# Patient Record
Sex: Female | Born: 1990 | Race: White | Hispanic: No | Marital: Married | Smoking: Never smoker
Health system: Southern US, Community
[De-identification: ages and names within clinical notes are randomized; demographics above are authoritative.]

## PROBLEM LIST (undated history)

## (undated) ENCOUNTER — Inpatient Hospital Stay (HOSPITAL_COMMUNITY): Payer: Self-pay

## (undated) DIAGNOSIS — Z789 Other specified health status: Secondary | ICD-10-CM

## (undated) DIAGNOSIS — D649 Anemia, unspecified: Secondary | ICD-10-CM

---
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
## 2014-02-01 ENCOUNTER — Inpatient Hospital Stay (HOSPITAL_COMMUNITY): Admission: AD | Admit: 2014-02-01 | Payer: Self-pay | Source: Ambulatory Visit | Admitting: Obstetrics & Gynecology

## 2015-01-07 LAB — OB RESULTS CONSOLE RUBELLA ANTIBODY, IGM: Rubella: IMMUNE

## 2015-01-07 LAB — OB RESULTS CONSOLE ABO/RH: RH Type: POSITIVE

## 2015-01-07 LAB — OB RESULTS CONSOLE HEPATITIS B SURFACE ANTIGEN: Hepatitis B Surface Ag: NEGATIVE

## 2015-01-07 LAB — OB RESULTS CONSOLE ANTIBODY SCREEN: Antibody Screen: NEGATIVE

## 2015-01-07 LAB — OB RESULTS CONSOLE GC/CHLAMYDIA
CHLAMYDIA, DNA PROBE: NEGATIVE
GC PROBE AMP, GENITAL: NEGATIVE

## 2015-01-07 LAB — OB RESULTS CONSOLE GBS: STREP GROUP B AG: POSITIVE

## 2015-01-07 LAB — OB RESULTS CONSOLE HIV ANTIBODY (ROUTINE TESTING): HIV: NONREACTIVE

## 2015-01-07 LAB — OB RESULTS CONSOLE RPR: RPR: NONREACTIVE

## 2015-02-02 NOTE — L&D Delivery Note (Signed)
Delivery Note At 1:43 AM a viable female was delivered via Vaginal, Spontaneous Delivery (Presentation: Right Occiput Anterior).  APGAR: 8, 9; weight  .   Placenta status: Intact, Spontaneous.  Cord: 3 vessels with the following complications: None.  Cord pH: not sent Fetal cord blood collected Anesthesia: Epidural  Episiotomy: None Lacerations: 2nd degree;Perineal Suture Repair: 3.0 vicryl rapide Est. Blood Loss (mL): 300  Mom to postpartum.  Baby to Couplet care / Skin to Skin.  Tashonna Descoteaux M 08/19/2015, 2:06 AM

## 2015-04-07 ENCOUNTER — Encounter (HOSPITAL_COMMUNITY): Payer: Self-pay

## 2015-04-07 ENCOUNTER — Inpatient Hospital Stay (HOSPITAL_COMMUNITY)
Admission: AD | Admit: 2015-04-07 | Discharge: 2015-04-07 | Disposition: A | Discharge status: Home / Self Care | Payer: BLUE CROSS/BLUE SHIELD | Source: Ambulatory Visit | Attending: Obstetrics and Gynecology | Admitting: Obstetrics and Gynecology

## 2015-04-07 DIAGNOSIS — O9989 Other specified diseases and conditions complicating pregnancy, childbirth and the puerperium: Secondary | ICD-10-CM

## 2015-04-07 DIAGNOSIS — O99012 Anemia complicating pregnancy, second trimester: Secondary | ICD-10-CM | POA: Insufficient documentation

## 2015-04-07 DIAGNOSIS — R55 Syncope and collapse: Secondary | ICD-10-CM | POA: Diagnosis not present

## 2015-04-07 DIAGNOSIS — D649 Anemia, unspecified: Secondary | ICD-10-CM

## 2015-04-07 DIAGNOSIS — O26892 Other specified pregnancy related conditions, second trimester: Secondary | ICD-10-CM | POA: Diagnosis not present

## 2015-04-07 DIAGNOSIS — Z3A21 21 weeks gestation of pregnancy: Secondary | ICD-10-CM | POA: Diagnosis not present

## 2015-04-07 DIAGNOSIS — E162 Hypoglycemia, unspecified: Secondary | ICD-10-CM | POA: Diagnosis not present

## 2015-04-07 DIAGNOSIS — O99891 Other specified diseases and conditions complicating pregnancy: Secondary | ICD-10-CM

## 2015-04-07 DIAGNOSIS — R8271 Bacteriuria: Secondary | ICD-10-CM | POA: Diagnosis not present

## 2015-04-07 HISTORY — DX: Other specified health status: Z78.9

## 2015-04-07 LAB — URINALYSIS, ROUTINE W REFLEX MICROSCOPIC
BILIRUBIN URINE: NEGATIVE
Glucose, UA: NEGATIVE mg/dL
KETONES UR: NEGATIVE mg/dL
LEUKOCYTES UA: NEGATIVE
NITRITE: NEGATIVE
PH: 7 (ref 5.0–8.0)
PROTEIN: NEGATIVE mg/dL
Specific Gravity, Urine: 1.015 (ref 1.005–1.030)

## 2015-04-07 LAB — CBC
HEMATOCRIT: 32 % — AB (ref 36.0–46.0)
HEMOGLOBIN: 10.9 g/dL — AB (ref 12.0–15.0)
MCH: 32.3 pg (ref 26.0–34.0)
MCHC: 34.1 g/dL (ref 30.0–36.0)
MCV: 95 fL (ref 78.0–100.0)
Platelets: 195 10*3/uL (ref 150–400)
RBC: 3.37 MIL/uL — ABNORMAL LOW (ref 3.87–5.11)
RDW: 13.2 % (ref 11.5–15.5)
WBC: 9.2 10*3/uL (ref 4.0–10.5)

## 2015-04-07 LAB — URINE MICROSCOPIC-ADD ON

## 2015-04-07 LAB — GLUCOSE, CAPILLARY
Glucose-Capillary: 66 mg/dL (ref 65–99)
Glucose-Capillary: 24 mg/dL — CL (ref 65–99)

## 2015-04-07 MED ORDER — CEPHALEXIN 500 MG PO CAPS: 500.0000 mg | ORAL_CAPSULE | Freq: Four times a day (QID) | ORAL | Status: DC

## 2015-04-07 NOTE — MAU Provider Note (Signed)
History     CSN: [id number]  Arrival date and time: 04/07/15 0904   First Provider Initiated Contact with Patient 04/07/15 1012      Chief Complaint  Patient presents with  . Loss of Consciousness   HPI   Ms.Bailie Mayer Camelatum is a 25 y.o. female G1P0 @ 23106w3d presenting via EMS following a syncopal episode that occurred at work this morning. The patient attests to missing breakfast this morning; last meal was dinner last night at 5 pm. She drank some almond milk only. She was sitting at her desk and became hot and nauseated. She stood up and felt dizzy; she leaned on a co-worker who was standing next to her and vaguely remembers being lowered to the floor by her co-worker.   She denies symptoms currently.    RN note:  Pt was at work this morning and started feeling dizzy. Pt was standing next to her co-worker and she blacked out. Her co-worker caught her and assisted her to sit down. Pt had one glass of almond milk this morning but had not eaten yet.         OB History    Gravida Para Term Preterm AB TAB SAB Ectopic Multiple Living   1         0      Past Medical History  Diagnosis Date  . Medical history non-contributory     Past Surgical History  Procedure Laterality Date  . Wisdom tooth extraction  2005    Family History  Problem Relation Age of Onset  . Cancer Maternal Grandmother   . Stroke Maternal Grandfather   . Diabetes Paternal Grandmother   . Heart disease Paternal Grandfather     Social History  Substance Use Topics  . Smoking status: Never Smoker   . Smokeless tobacco: Never Used  . Alcohol Use: No    Allergies:  Allergies  Allergen Reactions  . Benzoyl Peroxide Swelling    Prescriptions prior to admission  Medication Sig Dispense Refill Last Dose  . clindamycin (CLEOCIN T) 1 % lotion Apply 1 application topically daily.   04/06/2015 at Unknown time  . Prenatal Vit-Fe Fumarate-FA (PRENATAL MULTIVITAMIN) TABS tablet Take 1 tablet by mouth  daily at 12 noon.   04/06/2015 at Unknown time   Results for orders placed or performed during the hospital encounter of 04/07/15 (from the past 72 hour(s))  Urinalysis, Routine w reflex microscopic (not at [facility])     Status: Abnormal   Collection Time: 04/07/15  9:50 AM  Result Value Ref Range   Color, Urine YELLOW YELLOW   APPearance CLOUDY (A) CLEAR   Specific Gravity, Urine 1.015 1.005 - 1.030   pH 7.0 5.0 - 8.0   Glucose, UA NEGATIVE NEGATIVE mg/dL   Hgb urine dipstick MODERATE (A) NEGATIVE   Bilirubin Urine NEGATIVE NEGATIVE   Ketones, ur NEGATIVE NEGATIVE mg/dL   Protein, ur NEGATIVE NEGATIVE mg/dL   Nitrite NEGATIVE NEGATIVE   Leukocytes, UA NEGATIVE NEGATIVE  Urine microscopic-add on     Status: Abnormal   Collection Time: 04/07/15  9:50 AM  Result Value Ref Range   Squamous Epithelial / LPF 0-5 (A) NONE SEEN   WBC, UA 0-5 0 - 5 WBC/hpf   RBC / HPF 6-30 0 - 5 RBC/hpf   Bacteria, UA MANY (A) NONE SEEN   Urine-Other MUCOUS PRESENT     Comment: AMORPHOUS URATES/PHOSPHATES  Glucose, capillary     Status: Abnormal   Collection Time: 04/07/15  9:54  AM  Result Value Ref Range   Glucose-Capillary 24 (LL) 65 - 99 mg/dL   Comment 1 Repeat Test   Glucose, capillary     Status: None   Collection Time: 04/07/15  9:56 AM  Result Value Ref Range   Glucose-Capillary 66 65 - 99 mg/dL  CBC     Status: Abnormal   Collection Time: 04/07/15 11:01 AM  Result Value Ref Range   WBC 9.2 4.0 - 10.5 K/uL   RBC 3.37 (L) 3.87 - 5.11 MIL/uL   Hemoglobin 10.9 (L) 12.0 - 15.0 g/dL   HCT 16.1 (L) 09.6 - 04.5 %   MCV 95.0 78.0 - 100.0 fL   MCH 32.3 26.0 - 34.0 pg   MCHC 34.1 30.0 - 36.0 g/dL   RDW 40.9 81.1 - 91.4 %   Platelets 195 150 - 400 K/uL    Review of Systems  Constitutional: Negative for malaise/fatigue.  Cardiovascular: Negative for chest pain.  Genitourinary: Positive for frequency. Negative for dysuria, urgency and flank pain.   Physical Exam   Blood pressure 97/53, pulse 78,  temperature 98.4 F (36.9 C), temperature source Oral, resp. rate 17, height  (1.549 m), weight 126 lb (57.153 kg), SpO2 100 %.  Physical Exam  Constitutional: She is oriented to person, place, and time. She appears well-developed and well-nourished. No distress.  Cardiovascular: Normal rate and normal heart sounds.   Respiratory: Effort normal and breath sounds normal.  Musculoskeletal: Normal range of motion.  Neurological: She is alert and oriented to person, place, and time. GCS eye subscore is 4. GCS verbal subscore is 5. GCS motor subscore is 6.  Skin: Skin is warm. She is not diaphoretic. No pallor.  Psychiatric: Her behavior is normal.    MAU Course  Procedures  None  MDM  CBG: 66. Patient given apple juice and peanut butter crackers.  Orthostatic vitals normal  Discussed patient with Dr. Rana Snare; CBC recommended and drawn prior to discharge.  + fetal heart tones by doppler.   Assessment and Plan   A:  1. Syncope, unspecified syncope type   2. Spontaneous hypoglycemia   3. Asymptomatic bacteriuria during pregnancy   4. Anemia in pregnancy, second trimester     P:  Discharge home in stable condition Over the counter iron daily as directed on the bottle Small, frequent meals throughout the day RX: Keflex Urine culture pending  Return to MAU if symptoms return-persist Follow up with OB as scheduled  Duane Lope, NP 04/07/2015 7:05 PM

## 2015-04-07 NOTE — Discharge Instructions (Signed)
Syncope Syncope means a person passes out (faints). The person usually wakes up in less than 5 minutes. It is important to seek medical care for syncope. HOME CARE  Have someone stay with you until you feel normal.  Do not drive, use machines, or play sports until your doctor says it is okay.  Keep all doctor visits as told.  Lie down when you feel like you might pass out. Take deep breaths. Wait until you feel normal before standing up.  Drink enough fluids to keep your pee (urine) clear or pale yellow.  If you take blood pressure or heart medicine, get up slowly. Take several minutes to sit and then stand. GET HELP RIGHT AWAY IF:   You have a severe headache. You have pain in the chest, belly (abdomen), or back.Pregnancy and Urinary Tract Infection A urinary tract infection (UTI) is a bacterial infection of the urinary tract. Infection of the urinary tract can include the ureters, kidneys (pyelonephritis), bladder (cystitis), and urethra (urethritis). All pregnant women should be screened for bacteria in the urinary tract. Identifying and treating a UTI will decrease the risk of preterm labor and developing more serious infections in both the mother and baby. CAUSES Bacteria germs cause almost all UTIs.  RISK FACTORS Many factors can increase your chances of getting a UTI during pregnancy. These include: Having a short urethra. Poor toilet and hygiene habits. Sexual intercourse. Blockage of urine along the urinary tract. Problems with the pelvic muscles or nerves. Diabetes. Obesity. Bladder problems after having several children. Previous history of UTI. SIGNS AND SYMPTOMS  Pain, burning, or a stinging feeling when urinating. Suddenly feeling the need to urinate right away (urgency). Loss of bladder control (urinary incontinence). Frequent urination, more than is common with pregnancy. Lower abdominal or back discomfort. Cloudy urine. Blood in the urine  (hematuria). Fever. When the kidneys are infected, the symptoms may be: Back pain. Flank pain on the right side more so than the left. Fever. Chills. Nausea. Vomiting. DIAGNOSIS  A urinary tract infection is usually diagnosed through urine tests. Additional tests and procedures are sometimes done. These may include: Ultrasound exam of the kidneys, ureters, bladder, and urethra. Looking in the bladder with a lighted tube (cystoscopy). TREATMENT Typically, UTIs can be treated with antibiotic medicines.  HOME CARE INSTRUCTIONS  Only take over-the-counter or prescription medicines as directed by your health care provider. If you were prescribed antibiotics, take them as directed. Finish them even if you start to feel better. Drink enough fluids to keep your urine clear or pale yellow. Do not have sexual intercourse until the infection is gone and your health care provider says it is okay. Make sure you are tested for UTIs throughout your pregnancy. These infections often come back. Preventing a UTI in the Future Practice good toilet habits. Always wipe from front to back. Use the tissue only once. Do not hold your urine. Empty your bladder as soon as possible when the urge comes. Do not douche or use deodorant sprays. Wash with soap and warm water around the genital area and the anus. Empty your bladder before and after sexual intercourse. Wear underwear with a cotton crotch. Avoid caffeine and carbonated drinks. They can irritate the bladder. Drink cranberry juice or take cranberry pills. This may decrease the risk of getting a UTI. Do not drink alcohol. Keep all your appointments and tests as scheduled. SEEK MEDICAL CARE IF:  Your symptoms get worse. You are still having fevers 2 or more days  after treatment begins. You have a rash. You feel that you are having problems with medicines prescribed. You have abnormal vaginal discharge. SEEK IMMEDIATE MEDICAL CARE IF:  You have  back or flank pain. You have chills. You have blood in your urine. You have nausea and vomiting. You have contractions of your uterus. You have a gush of fluid from the vagina. MAKE SURE YOU: Understand these instructions.  Will watch your condition.  Will get help right away if you are not doing well or get worse.    This information is not intended to replace advice given to you by your health care provider. Make sure you discuss any questions you have with your health care provider.   Document Released: 05/15/2010 Document Revised: 11/08/2012 Document Reviewed: 08/17/2012 Elsevier Interactive Patient Education 2016 [employer]    You are bleeding from the mouth or butt (rectum).  You have black or tarry poop (stool).  You have an irregular or very fast heartbeat.  You have pain with breathing.  You keep passing out, or you have shaking (seizures) when you pass out.  You pass out when sitting or lying down.  You feel confused.  You have trouble walking.  You have severe weakness.  You have vision problems. If you fainted, call for help (911 in [place] Do not drive yourself to the hospital.   This information is not intended to replace advice given to you by your health care provider. Make sure you discuss any questions you have with your health care provider.   Document Released: 07/07/2007 Document Revised: 06/04/2014 Document Reviewed: 03/19/2011 Elsevier Interactive Patient Education [employer] Inc.

## 2015-04-07 NOTE — Progress Notes (Signed)
CBG 66, pt given apple juice and graham crackers with peanut butter.

## 2015-04-07 NOTE — MAU Note (Signed)
Pt was at work this morning and started feeling dizzy. Pt was standing next to her co-worker and she blacked out. Her co-worker caught her and assisted her to sit down. Pt had one glass of almond milk this morning but had not eaten yet.

## 2015-04-08 LAB — URINE CULTURE
Culture: NO GROWTH
Special Requests: NORMAL

## 2015-07-21 ENCOUNTER — Encounter (HOSPITAL_COMMUNITY): Payer: Self-pay | Admitting: *Deleted

## 2015-07-21 ENCOUNTER — Inpatient Hospital Stay (HOSPITAL_COMMUNITY)
Admission: AD | Admit: 2015-07-21 | Discharge: 2015-07-21 | Disposition: A | Discharge status: Home / Self Care | Payer: BLUE CROSS/BLUE SHIELD | Source: Ambulatory Visit | Attending: Obstetrics and Gynecology | Admitting: Obstetrics and Gynecology

## 2015-07-21 DIAGNOSIS — Z823 Family history of stroke: Secondary | ICD-10-CM | POA: Diagnosis not present

## 2015-07-21 DIAGNOSIS — O368130 Decreased fetal movements, third trimester, not applicable or unspecified: Secondary | ICD-10-CM | POA: Diagnosis not present

## 2015-07-21 DIAGNOSIS — Z888 Allergy status to other drugs, medicaments and biological substances status: Secondary | ICD-10-CM | POA: Insufficient documentation

## 2015-07-21 DIAGNOSIS — Z3A36 36 weeks gestation of pregnancy: Secondary | ICD-10-CM | POA: Insufficient documentation

## 2015-07-21 DIAGNOSIS — Z8249 Family history of ischemic heart disease and other diseases of the circulatory system: Secondary | ICD-10-CM | POA: Insufficient documentation

## 2015-07-21 DIAGNOSIS — O368190 Decreased fetal movements, unspecified trimester, not applicable or unspecified: Secondary | ICD-10-CM

## 2015-07-21 DIAGNOSIS — Z833 Family history of diabetes mellitus: Secondary | ICD-10-CM | POA: Diagnosis not present

## 2015-07-21 DIAGNOSIS — Z809 Family history of malignant neoplasm, unspecified: Secondary | ICD-10-CM | POA: Insufficient documentation

## 2015-07-21 LAB — URINALYSIS, ROUTINE W REFLEX MICROSCOPIC
Bilirubin Urine: NEGATIVE
Glucose, UA: NEGATIVE mg/dL
Ketones, ur: NEGATIVE mg/dL
LEUKOCYTES UA: NEGATIVE
NITRITE: NEGATIVE
PH: 6.5 (ref 5.0–8.0)
PROTEIN: NEGATIVE mg/dL

## 2015-07-21 LAB — URINE MICROSCOPIC-ADD ON
Squamous Epithelial / HPF: NONE SEEN
WBC, UA: NONE SEEN WBC/hpf (ref 0–5)

## 2015-07-21 NOTE — MAU Provider Note (Signed)
MAU HISTORY AND PHYSICAL  Chief Complaint:  Decreased fetal movement  Krystal Cunningham is a 25 y.o.  G1P0 with IUP at 6471w3d presenting for the above.  Happened today. Now lots of movement, back to baseline. No leakage of fluid or bleeding. Occasional mild contraction. Otherwise feeling well.  Past Medical History  Diagnosis Date  . Medical history non-contributory     Past Surgical History  Procedure Laterality Date  . Wisdom tooth extraction  2005    Family History  Problem Relation Age of Onset  . Cancer Maternal Grandmother   . Stroke Maternal Grandfather   . Diabetes Paternal Grandmother   . Heart disease Paternal Grandfather     Social History  Substance Use Topics  . Smoking status: Never Smoker   . Smokeless tobacco: Never Used  . Alcohol Use: No    Allergies  Allergen Reactions  . Benzoyl Peroxide Swelling    Prescriptions prior to admission  Medication Sig Dispense Refill Last Dose  . cephALEXin (KEFLEX) 500 MG capsule Take 1 capsule (500 mg total) by mouth 4 (four) times daily. 20 capsule 0   . clindamycin (CLEOCIN T) 1 % lotion Apply 1 application topically daily.   04/06/2015 at Unknown time  . Prenatal Vit-Fe Fumarate-FA (PRENATAL MULTIVITAMIN) TABS tablet Take 1 tablet by mouth daily at 12 noon.   04/06/2015 at Unknown time    Review of Systems - Negative except for what is mentioned in HPI.  Physical Exam  Blood pressure 104/80, pulse 76, temperature 98.3 F (36.8 C), resp. rate 18. GENERAL: Well-developed, well-nourished female in no acute distress.  LUNGS: Clear to auscultation bilaterally.  HEART: Regular rate and rhythm. ABDOMEN: Soft, nontender, nondistended, gravid.  EXTREMITIES: Nontender, no edema, 2+ distal pulses. 140/mod/+a/-d  Labs: No results found for this or any previous visit (from the past 24 hour(s)).  Imaging Studies:  No results found.  Assessment: Krystal Cunningham is  25 y.o. G1P0 at 3471w3d presents with decreased fetal  movement. Transient this afternoon, now movement back to baseline. No symptoms pprom or abruption. NST reactive. Discussed w/ Dr. Rana SnareLowe.  Plan: - d/c home - clinic f/u this week as scheduled - counseled in kick counts - return precautions discussed  Krystal Cunningham 6/19/20175:37 PM

## 2015-07-21 NOTE — Discharge Instructions (Signed)
Fetal Movement Counts  Patient Name: __________________________________________________ Patient Due Date: ____________________  Performing a fetal movement count is highly recommended in high-risk pregnancies, but it is good for every pregnant woman to do. Your health care provider may ask you to start counting fetal movements at 28 weeks of the pregnancy. Fetal movements often increase:  · After eating a full meal.  · After physical activity.  · After eating or drinking something sweet or cold.  · At rest.  Pay attention to when you feel the baby is most active. This will help you notice a pattern of your baby's sleep and wake cycles and what factors contribute to an increase in fetal movement. It is important to perform a fetal movement count at the same time each day when your baby is normally most active.   HOW TO COUNT FETAL MOVEMENTS  1. Find a quiet and comfortable area to sit or lie down on your left side. Lying on your left side provides the best blood and oxygen circulation to your baby.  2. Write down the day and time on a sheet of paper or in a journal.  3. Start counting kicks, flutters, swishes, rolls, or jabs in a 2-hour period. You should feel at least 10 movements within 2 hours.  4. If you do not feel 10 movements in 2 hours, wait 2-3 hours and count again. Look for a change in the pattern or not enough counts in 2 hours.  SEEK MEDICAL CARE IF:  · You feel less than 10 counts in 2 hours, tried twice.  · There is no movement in over an hour.  · The pattern is changing or taking longer each day to reach 10 counts in 2 hours.  · You feel the baby is not moving as he or she usually does.  Date: ____________ Movements: ____________ Start time: ____________ Finish time: ____________   Date: ____________ Movements: ____________ Start time: ____________ Finish time: ____________  Date: ____________ Movements: ____________ Start time: ____________ Finish time: ____________  Date: ____________ Movements:  ____________ Start time: ____________ Finish time: ____________  Date: ____________ Movements: ____________ Start time: ____________ Finish time: ____________  Date: ____________ Movements: ____________ Start time: ____________ Finish time: ____________  Date: ____________ Movements: ____________ Start time: ____________ Finish time: ____________  Date: ____________ Movements: ____________ Start time: ____________ Finish time: ____________   Date: ____________ Movements: ____________ Start time: ____________ Finish time: ____________  Date: ____________ Movements: ____________ Start time: ____________ Finish time: ____________  Date: ____________ Movements: ____________ Start time: ____________ Finish time: ____________  Date: ____________ Movements: ____________ Start time: ____________ Finish time: ____________  Date: ____________ Movements: ____________ Start time: ____________ Finish time: ____________  Date: ____________ Movements: ____________ Start time: ____________ Finish time: ____________  Date: ____________ Movements: ____________ Start time: ____________ Finish time: ____________   Date: ____________ Movements: ____________ Start time: ____________ Finish time: ____________  Date: ____________ Movements: ____________ Start time: ____________ Finish time: ____________  Date: ____________ Movements: ____________ Start time: ____________ Finish time: ____________  Date: ____________ Movements: ____________ Start time: ____________ Finish time: ____________  Date: ____________ Movements: ____________ Start time: ____________ Finish time: ____________  Date: ____________ Movements: ____________ Start time: ____________ Finish time: ____________  Date: ____________ Movements: ____________ Start time: ____________ Finish time: ____________   Date: ____________ Movements: ____________ Start time: ____________ Finish time: ____________  Date: ____________ Movements: ____________ Start time: ____________ Finish  time: ____________  Date: ____________ Movements: ____________ Start time: ____________ Finish time: ____________  Date: ____________ Movements: ____________ Start time:   ____________ Finish time: ____________  Date: ____________ Movements: ____________ Start time: ____________ Finish time: ____________  Date: ____________ Movements: ____________ Start time: ____________ Finish time: ____________  Date: ____________ Movements: ____________ Start time: ____________ Finish time: ____________   Date: ____________ Movements: ____________ Start time: ____________ Finish time: ____________  Date: ____________ Movements: ____________ Start time: ____________ Finish time: ____________  Date: ____________ Movements: ____________ Start time: ____________ Finish time: ____________  Date: ____________ Movements: ____________ Start time: ____________ Finish time: ____________  Date: ____________ Movements: ____________ Start time: ____________ Finish time: ____________  Date: ____________ Movements: ____________ Start time: ____________ Finish time: ____________  Date: ____________ Movements: ____________ Start time: ____________ Finish time: ____________   Date: ____________ Movements: ____________ Start time: ____________ Finish time: ____________  Date: ____________ Movements: ____________ Start time: ____________ Finish time: ____________  Date: ____________ Movements: ____________ Start time: ____________ Finish time: ____________  Date: ____________ Movements: ____________ Start time: ____________ Finish time: ____________  Date: ____________ Movements: ____________ Start time: ____________ Finish time: ____________  Date: ____________ Movements: ____________ Start time: ____________ Finish time: ____________  Date: ____________ Movements: ____________ Start time: ____________ Finish time: ____________   Date: ____________ Movements: ____________ Start time: ____________ Finish time: ____________  Date: ____________  Movements: ____________ Start time: ____________ Finish time: ____________  Date: ____________ Movements: ____________ Start time: ____________ Finish time: ____________  Date: ____________ Movements: ____________ Start time: ____________ Finish time: ____________  Date: ____________ Movements: ____________ Start time: ____________ Finish time: ____________  Date: ____________ Movements: ____________ Start time: ____________ Finish time: ____________  Date: ____________ Movements: ____________ Start time: ____________ Finish time: ____________   Date: ____________ Movements: ____________ Start time: ____________ Finish time: ____________  Date: ____________ Movements: ____________ Start time: ____________ Finish time: ____________  Date: ____________ Movements: ____________ Start time: ____________ Finish time: ____________  Date: ____________ Movements: ____________ Start time: ____________ Finish time: ____________  Date: ____________ Movements: ____________ Start time: ____________ Finish time: ____________  Date: ____________ Movements: ____________ Start time: ____________ Finish time: ____________     This information is not intended to replace advice given to you by your health care provider. Make sure you discuss any questions you have with your health care provider.     Document Released: 02/17/2006 Document Revised: 02/08/2014 Document Reviewed: 11/15/2011  Elsevier Interactive Patient Education ©2016 Elsevier Inc.

## 2015-07-21 NOTE — MAU Note (Signed)
Pt presents to MAU for a decrease in fetal movement. Denies any vaginal bleeding or LOF

## 2015-08-11 ENCOUNTER — Telehealth (HOSPITAL_COMMUNITY): Payer: Self-pay | Admitting: *Deleted

## 2015-08-11 ENCOUNTER — Encounter (HOSPITAL_COMMUNITY): Payer: Self-pay | Admitting: *Deleted

## 2015-08-11 NOTE — Telephone Encounter (Signed)
Preadmission screen  

## 2015-08-16 ENCOUNTER — Inpatient Hospital Stay (HOSPITAL_COMMUNITY): Payer: BLUE CROSS/BLUE SHIELD

## 2015-08-18 ENCOUNTER — Encounter (HOSPITAL_COMMUNITY): Payer: Self-pay

## 2015-08-18 ENCOUNTER — Inpatient Hospital Stay (HOSPITAL_COMMUNITY): Payer: BLUE CROSS/BLUE SHIELD | Admitting: Anesthesiology

## 2015-08-18 ENCOUNTER — Inpatient Hospital Stay (HOSPITAL_COMMUNITY)
Admission: RE | Admit: 2015-08-18 | Discharge: 2015-08-21 | DRG: 775 | Disposition: A | Discharge status: Home / Self Care | Payer: BLUE CROSS/BLUE SHIELD | Source: Ambulatory Visit | Attending: Obstetrics & Gynecology | Admitting: Obstetrics & Gynecology

## 2015-08-18 DIAGNOSIS — Z3A40 40 weeks gestation of pregnancy: Secondary | ICD-10-CM | POA: Diagnosis not present

## 2015-08-18 DIAGNOSIS — O99824 Streptococcus B carrier state complicating childbirth: Secondary | ICD-10-CM | POA: Diagnosis present

## 2015-08-18 DIAGNOSIS — Z3403 Encounter for supervision of normal first pregnancy, third trimester: Secondary | ICD-10-CM | POA: Diagnosis present

## 2015-08-18 DIAGNOSIS — Z3490 Encounter for supervision of normal pregnancy, unspecified, unspecified trimester: Secondary | ICD-10-CM

## 2015-08-18 LAB — TYPE AND SCREEN
ABO/RH(D): A POS
Antibody Screen: NEGATIVE

## 2015-08-18 LAB — CBC
HCT: 33.3 % — ABNORMAL LOW (ref 36.0–46.0)
HEMOGLOBIN: 11.4 g/dL — AB (ref 12.0–15.0)
MCH: 30.9 pg (ref 26.0–34.0)
MCHC: 34.2 g/dL (ref 30.0–36.0)
MCV: 90.2 fL (ref 78.0–100.0)
PLATELETS: 190 10*3/uL (ref 150–400)
RBC: 3.69 MIL/uL — ABNORMAL LOW (ref 3.87–5.11)
RDW: 13.1 % (ref 11.5–15.5)
WBC: 9.3 10*3/uL (ref 4.0–10.5)

## 2015-08-18 LAB — ABO/RH: ABO/RH(D): A POS

## 2015-08-18 MED ORDER — LACTATED RINGERS IV SOLN
INTRAVENOUS | Status: DC
  Administered 2015-08-18: 22:00:00 via INTRAVENOUS

## 2015-08-18 MED ORDER — SOD CITRATE-CITRIC ACID 500-334 MG/5ML PO SOLN: 30.0000 mL | ORAL | Status: DC | PRN

## 2015-08-18 MED ORDER — LACTATED RINGERS IV SOLN
500.0000 mL | Freq: Once | INTRAVENOUS | Status: AC
Start: 2015-08-18 — End: 2015-08-18
  Administered 2015-08-18: 500 mL via INTRAVENOUS

## 2015-08-18 MED ORDER — FLEET ENEMA 7-19 GM/118ML RE ENEM: 1.0000 | ENEMA | RECTAL | Status: DC | PRN

## 2015-08-18 MED ORDER — PHENYLEPHRINE 40 MCG/ML (10ML) SYRINGE FOR IV PUSH (FOR BLOOD PRESSURE SUPPORT)
80.0000 ug | PREFILLED_SYRINGE | INTRAVENOUS | Status: DC | PRN
  Filled 2015-08-18: qty 5

## 2015-08-18 MED ORDER — OXYTOCIN 40 UNITS IN LACTATED RINGERS INFUSION - SIMPLE MED: 2.5000 [IU]/h | INTRAVENOUS | Status: DC

## 2015-08-18 MED ORDER — ZOLPIDEM TARTRATE 5 MG PO TABS: 5.0000 mg | ORAL_TABLET | Freq: Every evening | ORAL | Status: DC | PRN

## 2015-08-18 MED ORDER — ONDANSETRON HCL 4 MG/2ML IJ SOLN: 4.0000 mg | Freq: Four times a day (QID) | INTRAMUSCULAR | Status: DC | PRN

## 2015-08-18 MED ORDER — OXYCODONE-ACETAMINOPHEN 5-325 MG PO TABS: 1.0000 | ORAL_TABLET | ORAL | Status: DC | PRN

## 2015-08-18 MED ORDER — LACTATED RINGERS IV SOLN: 500.0000 mL | INTRAVENOUS | Status: DC | PRN

## 2015-08-18 MED ORDER — LIDOCAINE HCL (PF) 1 % IJ SOLN
INTRAMUSCULAR | Status: DC | PRN
  Administered 2015-08-18: 4 mL
  Administered 2015-08-18: 6 mL via EPIDURAL

## 2015-08-18 MED ORDER — EPHEDRINE 5 MG/ML INJ
10.0000 mg | INTRAVENOUS | Status: DC | PRN
  Filled 2015-08-18: qty 2

## 2015-08-18 MED ORDER — LIDOCAINE HCL (PF) 1 % IJ SOLN
30.0000 mL | INTRAMUSCULAR | Status: AC | PRN
  Administered 2015-08-19: 30 mL via SUBCUTANEOUS
  Filled 2015-08-18: qty 30

## 2015-08-18 MED ORDER — OXYCODONE-ACETAMINOPHEN 5-325 MG PO TABS: 2.0000 | ORAL_TABLET | ORAL | Status: DC | PRN

## 2015-08-18 MED ORDER — MISOPROSTOL 25 MCG QUARTER TABLET
25.0000 ug | ORAL_TABLET | ORAL | Status: DC | PRN
  Administered 2015-08-18: 25 ug via VAGINAL
  Filled 2015-08-18: qty 1
  Filled 2015-08-18: qty 0.25

## 2015-08-18 MED ORDER — TERBUTALINE SULFATE 1 MG/ML IJ SOLN
0.2500 mg | Freq: Once | INTRAMUSCULAR | Status: DC | PRN
  Filled 2015-08-18: qty 1

## 2015-08-18 MED ORDER — PHENYLEPHRINE 40 MCG/ML (10ML) SYRINGE FOR IV PUSH (FOR BLOOD PRESSURE SUPPORT)
80.0000 ug | PREFILLED_SYRINGE | INTRAVENOUS | Status: DC | PRN
  Filled 2015-08-18: qty 10
  Filled 2015-08-18: qty 5

## 2015-08-18 MED ORDER — DEXTROSE 5 % IV SOLN
2.5000 10*6.[IU] | INTRAVENOUS | Status: DC
Start: 2015-08-18 — End: 2015-08-19
  Administered 2015-08-18 (×3): 2.5 10*6.[IU] via INTRAVENOUS
  Filled 2015-08-18 (×10): qty 2.5

## 2015-08-18 MED ORDER — BUTORPHANOL TARTRATE 1 MG/ML IJ SOLN: 1.0000 mg | INTRAMUSCULAR | Status: DC | PRN

## 2015-08-18 MED ORDER — DIPHENHYDRAMINE HCL 50 MG/ML IJ SOLN: 12.5000 mg | INTRAMUSCULAR | Status: DC | PRN

## 2015-08-18 MED ORDER — ACETAMINOPHEN 325 MG PO TABS: 650.0000 mg | ORAL_TABLET | ORAL | Status: DC | PRN

## 2015-08-18 MED ORDER — FENTANYL 2.5 MCG/ML BUPIVACAINE 1/10 % EPIDURAL INFUSION (WH - ANES)
14.0000 mL/h | INTRAMUSCULAR | Status: DC | PRN
  Administered 2015-08-18 – 2015-08-19 (×2): 14 mL/h via EPIDURAL
  Filled 2015-08-18 (×2): qty 125

## 2015-08-18 MED ORDER — PENICILLIN G POTASSIUM 5000000 UNITS IJ SOLR
5.0000 10*6.[IU] | Freq: Once | INTRAVENOUS | Status: AC
  Administered 2015-08-18: 5 10*6.[IU] via INTRAVENOUS
  Filled 2015-08-18: qty 5

## 2015-08-18 MED ORDER — OXYTOCIN 40 UNITS IN LACTATED RINGERS INFUSION - SIMPLE MED
1.0000 m[IU]/min | INTRAVENOUS | Status: DC
  Administered 2015-08-18: 2 m[IU]/min via INTRAVENOUS
  Filled 2015-08-18: qty 1000

## 2015-08-18 MED ORDER — OXYTOCIN BOLUS FROM INFUSION
500.0000 mL | INTRAVENOUS | Status: DC
  Administered 2015-08-19: 500 mL via INTRAVENOUS

## 2015-08-18 NOTE — Anesthesia Pain Management Evaluation Note (Signed)
  [facility] Pain Management Visit Note  Patient: Krystal SnellenSpenser Tatum, 25 y.o., female  "Hello I am a member of the anesthesia team at [facility]. We have an anesthesia team available at all times to provide care throughout the hospital, including epidural management and anesthesia for C-section. I don't know your plan for the delivery whether it a natural birth, water birth, IV sedation, nitrous supplementation, doula or epidural, but we want to meet your pain goals."   1.Was your pain managed to your expectations on prior hospitalizations?   No prior hospitalizations  2.What is your expectation for pain management during this hospitalization?     Epidural  3.How can we help you reach that goal? epidural  Record the patient's initial score and the patient's pain goal.   Pain: 2  Pain Goal: 5 [facility] wants you to be able to say your pain was always managed very well.  Kadyn Chovan 08/18/2015

## 2015-08-18 NOTE — Progress Notes (Signed)
Starting pit per protocol for decr ctx, now 1+/25/  AROM>>CLEAR/[facility]

## 2015-08-18 NOTE — Anesthesia Preprocedure Evaluation (Signed)

## 2015-08-18 NOTE — Progress Notes (Signed)
Now comft w/ epid, cx 3/75/vtx, IUPC placed to optimize pit, FHR cat I

## 2015-08-18 NOTE — H&P (Signed)
  Livia SnellenSpenser Tatum  DICTATION # [id number] SN# [id number]   Meriel PicaHOLLAND,Kirklin Mcduffee M, MD 08/18/2015 9:19 AM    [id number]

## 2015-08-18 NOTE — Anesthesia Procedure Notes (Signed)

## 2015-08-18 NOTE — H&P (Signed)
Krystal Cunningham:  Krystal Cunningham, Krystal Cunningham               ACCOUNT NO.:  [id number]  MEDICAL RECORD NO.:  [id number]  LOCATION:                                 FACILITY:  PHYSICIAN:  Duke Salviaichard M. Marcelle OverlieHolland, M.D.DATE OF BIRTH:  [birth year]  DATE OF ADMISSION:  08/18/2015 DATE OF DISCHARGE:                             HISTORY & PHYSICAL   CHIEF COMPLAINT:  For labor induction at term.  HISTORY OF PRESENT ILLNESS:  A 25 year old, G1, P0, EDD July 14.  She presents now at term for two-stage labor induction.  Prenatal course has been unremarkable.  She is GBS positive and antibiotics will need to be given in labor.  Her blood type is A positive.  Her 1 hour GTT was normal at 87.  Last ultrasound dated July 14.  EFW was 7 pounds 6 ounces with a BPP 8/8.  PAST MEDICAL HISTORY:  Please see the [facility] form for details.  PHYSICAL EXAMINATION:  VITAL SIGNS:  Temp 98.2, blood pressure 118/78. HEENT:  Unremarkable. NECK:  Supple.  No masses. LUNGS:  Clear. CARDIOVASCULAR:  Regular rate and rhythm without murmurs, rubs, or gallops. BREASTS:  Not examined.  Term fundal height.  Fetal heart rate was 140. Cervix was 1 thick vertex. EXTREMITIES:  Unremarkable. NEUROLOGIC:  Unremarkable.  IMPRESSION:  Term pregnancy for two-stage labor induction protocol reviewed with the patient and Dr. ______.     Krystal Cunningham M. Marcelle OverlieHolland, M.D.   ______________________________ Duke Salviaichard M. Marcelle OverlieHolland, M.D.    [facility]/MEDQ  D:  08/18/2015  T:  08/18/2015  Job:  [id number]

## 2015-08-19 ENCOUNTER — Encounter (HOSPITAL_COMMUNITY): Payer: Self-pay

## 2015-08-19 LAB — CBC
HEMATOCRIT: 28.4 % — AB (ref 36.0–46.0)
HEMOGLOBIN: 9.8 g/dL — AB (ref 12.0–15.0)
MCH: 31.7 pg (ref 26.0–34.0)
MCHC: 34.5 g/dL (ref 30.0–36.0)
MCV: 91.9 fL (ref 78.0–100.0)
Platelets: 176 10*3/uL (ref 150–400)
RBC: 3.09 MIL/uL — ABNORMAL LOW (ref 3.87–5.11)
RDW: 13.3 % (ref 11.5–15.5)
WBC: 18.2 10*3/uL — ABNORMAL HIGH (ref 4.0–10.5)

## 2015-08-19 LAB — RPR: RPR: NONREACTIVE

## 2015-08-19 MED ORDER — ONDANSETRON HCL 4 MG/2ML IJ SOLN: 4.0000 mg | INTRAMUSCULAR | Status: DC | PRN

## 2015-08-19 MED ORDER — SIMETHICONE 80 MG PO CHEW: 80.0000 mg | CHEWABLE_TABLET | ORAL | Status: DC | PRN

## 2015-08-19 MED ORDER — DIPHENHYDRAMINE HCL 25 MG PO CAPS: 25.0000 mg | ORAL_CAPSULE | Freq: Four times a day (QID) | ORAL | Status: DC | PRN

## 2015-08-19 MED ORDER — COCONUT OIL OIL
1.0000 "application " | TOPICAL_OIL | Status: DC | PRN
  Filled 2015-08-19: qty 120

## 2015-08-19 MED ORDER — SENNOSIDES-DOCUSATE SODIUM 8.6-50 MG PO TABS
2.0000 | ORAL_TABLET | ORAL | Status: DC
  Administered 2015-08-21: 2 via ORAL
  Filled 2015-08-19: qty 2

## 2015-08-19 MED ORDER — ACETAMINOPHEN 325 MG PO TABS
650.0000 mg | ORAL_TABLET | ORAL | Status: DC | PRN
  Administered 2015-08-20 – 2015-08-21 (×2): 650 mg via ORAL
  Filled 2015-08-19 (×2): qty 2

## 2015-08-19 MED ORDER — DIBUCAINE 1 % RE OINT: 1.0000 "application " | TOPICAL_OINTMENT | RECTAL | Status: DC | PRN

## 2015-08-19 MED ORDER — ZOLPIDEM TARTRATE 5 MG PO TABS: 5.0000 mg | ORAL_TABLET | Freq: Every evening | ORAL | Status: DC | PRN

## 2015-08-19 MED ORDER — FLEET ENEMA 7-19 GM/118ML RE ENEM: 1.0000 | ENEMA | Freq: Every day | RECTAL | Status: DC | PRN

## 2015-08-19 MED ORDER — WITCH HAZEL-GLYCERIN EX PADS: 1.0000 "application " | MEDICATED_PAD | CUTANEOUS | Status: DC | PRN

## 2015-08-19 MED ORDER — BISACODYL 10 MG RE SUPP: 10.0000 mg | Freq: Every day | RECTAL | Status: DC | PRN

## 2015-08-19 MED ORDER — TETANUS-DIPHTH-ACELL PERTUSSIS 5-2.5-18.5 LF-MCG/0.5 IM SUSP: 0.5000 mL | Freq: Once | INTRAMUSCULAR | Status: DC

## 2015-08-19 MED ORDER — BENZOCAINE-MENTHOL 20-0.5 % EX AERO
1.0000 "application " | INHALATION_SPRAY | CUTANEOUS | Status: DC | PRN
  Filled 2015-08-19: qty 56

## 2015-08-19 MED ORDER — OXYCODONE-ACETAMINOPHEN 5-325 MG PO TABS: 1.0000 | ORAL_TABLET | ORAL | Status: DC | PRN

## 2015-08-19 MED ORDER — PRENATAL MULTIVITAMIN CH
1.0000 | ORAL_TABLET | Freq: Every day | ORAL | Status: DC
  Administered 2015-08-19 – 2015-08-20 (×3): 1 via ORAL
  Filled 2015-08-19 (×2): qty 1

## 2015-08-19 MED ORDER — OXYCODONE-ACETAMINOPHEN 5-325 MG PO TABS: 2.0000 | ORAL_TABLET | ORAL | Status: DC | PRN

## 2015-08-19 MED ORDER — ONDANSETRON HCL 4 MG PO TABS: 4.0000 mg | ORAL_TABLET | ORAL | Status: DC | PRN

## 2015-08-19 MED ORDER — IBUPROFEN 800 MG PO TABS
800.0000 mg | ORAL_TABLET | Freq: Three times a day (TID) | ORAL | Status: DC | PRN
  Administered 2015-08-19 – 2015-08-21 (×6): 800 mg via ORAL
  Filled 2015-08-19 (×7): qty 1

## 2015-08-19 MED ORDER — MEASLES, MUMPS & RUBELLA VAC ~~LOC~~ INJ
0.5000 mL | INJECTION | Freq: Once | SUBCUTANEOUS | Status: DC
  Filled 2015-08-19: qty 0.5

## 2015-08-19 NOTE — Lactation Note (Signed)
This note was copied from a baby's chart. Lactation Consultation Note New mom states BF has done well the couple of times she has BF. Mom has firm round small breast w/small areola and tiny nipple w/no very short shaft. Areola slightly compressible . Hand massaged, hand expressed colostrum. Discussed positioning, encouraged football to obtain a deep latch until baby figures out how to BF well and open wide. Discussed chin tug if needed.  Educated about newborn behavior, I&O, cluster feeding, supply and demand. Mom encouraged to feed baby 8-12 times/24 hours and with feeding cues. Referred to Baby and Me Book in Breastfeeding section Pg. 22-23 for position options and Proper latch demonstration.[facility]/LC brochure given w/resources, support groups and LC services. Patient Name: Krystal Livia SnellenSpenser Tatum WUJWJ'XToday's Date: 08/19/2015 Reason for consult: Initial assessment   Maternal Data Has patient been taught Hand Expression?: Yes Does the patient have breastfeeding experience prior to this delivery?: No  Feeding Feeding Type: Breast Fed Length of feed: 10 min  LATCH Score/Interventions Latch: Repeated attempts needed to sustain latch, nipple held in mouth throughout feeding, stimulation needed to elicit sucking reflex.  Audible Swallowing: A few with stimulation  Type of Nipple: Everted at rest and after stimulation (short shaft)  Comfort (Breast/Nipple): Soft / non-tender     Hold (Positioning): Assistance needed to correctly position infant at breast and maintain latch.  LATCH Score: 7  Lactation Tools Discussed/Used     Consult Status Consult Status: Follow-up Date: 08/19/15 (in pm) Follow-up type: In-patient    Charyl DancerCARVER, Odean Mcelwain G 08/19/2015, 4:40 AM

## 2015-08-19 NOTE — Lactation Note (Signed)
This note was copied from a baby's chart. Lactation Consultation Note  Patient Name: Krystal Cunningham SnellenSpenser Tatum BMWUX'LToday's Date: 08/19/2015 Reason for consult: Follow-up assessment;Breast/nipple pain  Visited with Mom and FOB, baby 14 hrs old.  Mom states she has been having a difficult time, and she showed me her right breast which has a bruise on upper outer areola, along with bruised stripe on nipple tip.  Offered to assist with a feeding, baby asleep and bundled in her crib.  Baby has had 3 feedings, but Mom wanted an assist.  Baby awakened easily.  Tried cross cradle, and laid back positions to help baby open her mouth wide enough.  She has a little mouth, with a tight suck.  Tongue extends and cups the finger on suck assessment.  Tried football hold, and baby opened wider, but would not stay deeply latched, and would fall asleep.  Initiated a 20 mm nipple shield, and baby latched.  Teaching on importance of sandwiching breast at base of breast (due to small breast size) to help facilitate a deeper latch.  After about 5 minutes, baby relaxed her mouth, and began sucking with deeper jaw extensions.  Swallows heard.  Baby fed for 20 minutes, colostrum seen in the shield.  Baby burped, had a void and stool after feeding.  Set up DEBP at bedside with instructions on use and cleaning.  Mom to try to pump after breast feeding, and use any EBM in nipple shield using curved tip syringe.  Mom aware of importance of colostrum to nipples and areolas for healing.  RN aware of request for coconut oil.   To call her RN for assistance as needed, and LC prn.  LC to follow up in am.  LATCH Score/Interventions Latch: Repeated attempts needed to sustain latch, nipple held in mouth throughout feeding, stimulation needed to elicit sucking reflex. Intervention(s): Adjust position;Assist with latch;Breast massage;Breast compression  Audible Swallowing: A few with stimulation (became more regular midway into  feeding) Intervention(s): Hand expression;Skin to skin;Alternate breast massage  Type of Nipple: Everted at rest and after stimulation  Comfort (Breast/Nipple): Filling, red/small blisters or bruises, mild/mod discomfort  Problem noted: Cracked, bleeding, blisters, bruises;Mild/Moderate discomfort Interventions  (Cracked/bleeding/bruising/blister): Expressed breast milk to nipple;Double electric pump Interventions (Mild/moderate discomfort): Post-pump;Hand massage;Hand expression  Hold (Positioning): Assistance needed to correctly position infant at breast and maintain latch. Intervention(s): Breastfeeding basics reviewed;Support Pillows;Position options;Skin to skin  LATCH Score: 6  Lactation Tools Discussed/Used Tools: Nipple Dorris CarnesShields;Flanges Nipple shield size: 20 Flange Size: Other (comment) (21 mm ) Pump Review: Setup, frequency, and cleaning;Milk Storage Initiated by:: Sheela Stackaroine Raj RN IBCLC Date initiated:: 08/19/15   Consult Status Consult Status: Follow-up Date: 08/20/15 Follow-up type: In-patient    Judee ClaraSmith, Fox Salminen E 08/19/2015, 4:36 PM

## 2015-08-19 NOTE — Progress Notes (Signed)
Post Partum Day 0 Subjective: no complaints, up ad lib, voiding and tolerating PO  Objective: Blood pressure 105/65, pulse 80, temperature 98 F (36.7 C), temperature source Oral, resp. rate 16, height 5' (1.524 m), weight 144 lb (65.318 kg), SpO2 100 %, unknown if currently breastfeeding.  Physical Exam:  General: alert and cooperative Lochia: appropriate Uterine Fundus: firm Incision: healing well, small labial edema DVT Evaluation: No evidence of DVT seen on physical exam. Negative Homan's sign. No cords or calf tenderness. No significant calf/ankle edema.   Recent Labs  08/18/15 0058 08/19/15 0531  HGB 11.4* 9.8*  HCT 33.3* 28.4*    Assessment/Plan: Plan for discharge tomorrow   LOS: 1 day   CURTIS,CAROL G 08/19/2015, 7:42 AM

## 2015-08-19 NOTE — Anesthesia Postprocedure Evaluation (Addendum)
Anesthesia Post Note  Patient: Krystal Cunningham  Procedure(s) Performed: * No procedures listed *  Patient location during evaluation: Mother Baby Anesthesia Type: Epidural Level of consciousness: awake, awake and alert and oriented Pain management: pain level controlled Vital Signs Assessment: post-procedure vital signs reviewed and stable Respiratory status: spontaneous breathing, nonlabored ventilation and respiratory function stable Cardiovascular status: stable Postop Assessment: no headache, no backache, patient able to bend at knees, no signs of nausea or vomiting and adequate PO intake Anesthetic complications: no     Last Vitals:  Filed Vitals:   08/19/15 0343 08/19/15 0434  BP: 114/71 105/65  Pulse: 89 80  Temp: 36.8 C 36.7 C  Resp: 16 16    Last Pain:  Filed Vitals:   08/19/15 0445  PainSc: 2    Pain Goal: Patients Stated Pain Goal: 2 (08/19/15 0434)               Sabastian Raimondi

## 2015-08-20 NOTE — Progress Notes (Signed)
Post Partum Day 1 Subjective: no complaints, up ad lib, voiding, tolerating PO and + flatus  Objective: Blood pressure 107/77, pulse 62, temperature 98.7 F (37.1 C), temperature source Oral, resp. rate 19, height 5' (1.524 m), weight 144 lb (65.318 kg), SpO2 100 %, unknown if currently breastfeeding.  Physical Exam:  General: alert and cooperative Lochia: appropriate Uterine Fundus: firm Incision: healing well DVT Evaluation: No evidence of DVT seen on physical exam. Negative Homan's sign. No cords or calf tenderness. No significant calf/ankle edema.   Recent Labs  08/18/15 0058 08/19/15 0531  HGB 11.4* 9.8*  HCT 33.3* 28.4*    Assessment/Plan: Plan for discharge tomorrow   LOS: 2 days   Krystal Cunningham G 08/20/2015, 8:10 AM

## 2015-08-20 NOTE — Lactation Note (Signed)
This note was copied from a baby's chart. Lactation Consultation Note  Patient Name: Krystal Cunningham ZOXWR'UToday's Date: 08/20/2015 Reason for consult: Follow-up assessment;Breast/nipple pain Follow up visit.  Mom c/o painful feedings.  Positional stripe noted on right nipple and small abrasion on left.  Mom states she has been using cradle hold.  Assisted mom with positioning baby in cross cradle hold.  Demonstrated to parents how to support and compress breast for a deeper latch.  Baby opened wide and latched easily.  Mom states feeding much more comfortable after initial latch discomfort.  Reviewed positioning for opposite side.  Mom doing some post pumping to stimulate milk supply.  She is using coconut oil to nipples.  Instructed parents to call for latch assist prn.  Maternal Data    Feeding Feeding Type: Breast Fed  LATCH Score/Interventions Latch: Grasps breast easily, tongue down, lips flanged, rhythmical sucking. Intervention(s): Waking techniques Intervention(s): Adjust position;Assist with latch;Breast massage;Breast compression  Audible Swallowing: A few with stimulation Intervention(s): Alternate breast massage  Type of Nipple: Everted at rest and after stimulation  Comfort (Breast/Nipple): Filling, red/small blisters or bruises, mild/mod discomfort  Problem noted: Mild/Moderate discomfort;Cracked, bleeding, blisters, bruises Interventions  (Cracked/bleeding/bruising/blister): Expressed breast milk to nipple  Hold (Positioning): Assistance needed to correctly position infant at breast and maintain latch. Intervention(s): Breastfeeding basics reviewed;Support Pillows;Position options  LATCH Score: 7  Lactation Tools Discussed/Used     Consult Status Consult Status: Follow-up Date: 08/21/15 Follow-up type: In-patient    Huston FoleyMOULDEN, Dyllan Kats S 08/20/2015, 2:23 PM

## 2015-08-21 NOTE — Discharge Summary (Signed)
Obstetric Discharge Summary Reason for Admission: induction of labor Prenatal Procedures: ultrasound Intrapartum Procedures: spontaneous vaginal delivery Postpartum Procedures: none Complications-Operative and Postpartum: 2 degree perineal laceration HEMOGLOBIN  Date Value Ref Range Status  08/19/2015 9.8* 12.0 - 15.0 g/dL Final   HCT  Date Value Ref Range Status  08/19/2015 28.4* 36.0 - 46.0 % Final    Physical Exam:  General: alert and cooperative Lochia: appropriate Uterine Fundus: firm Incision: healing well DVT Evaluation: No evidence of DVT seen on physical exam. Negative Homan's sign. No cords or calf tenderness. No significant calf/ankle edema.  Discharge Diagnoses: Term Pregnancy-delivered  Discharge Information: Date: 08/21/2015 Activity: pelvic rest Diet: routine Medications: PNV and Ibuprofen Condition: stable Instructions: refer to practice specific booklet Discharge to: home   Newborn Data: Live born female  Birth Weight: 6 lb 14.4 oz (3130 g) APGAR: 8, 9  Home with mother.  Johnavon Mcclafferty G 08/21/2015, 7:58 AM

## 2015-08-21 NOTE — Lactation Note (Signed)
This note was copied from a baby's chart. Lactation Consultation Note  Patient Name: Krystal Cunningham [id number]'JToday's Date: 08/21/2015 Reason for consult: Follow-up assessment Baby 56 hours old. Mom just latching baby when [facility] entered the room. Baby latched deeply, suckled rhythmically with lips flanged and a few swallows noted. Turned baby's body more to mom in cross-cradle position. Discussed how to assess jaundice with mom. Mom's alternate breast easily expressible for colostrum. Mom states that her breasts are starting to fill. Discussed engorgement prevention and treatment and enc nursing with cues and at least every 3 hours. Discussed progression of milk coming to volume, and discussed nursing/pumping and returning to work. Mom states that she has a personal pump at home. Referred parents to Baby and Me booklet for number of diapers to expect by day of life and EBM storage guidelines. Mom aware of OP/BFSG and LC phone line assistance after D/C.    Maternal Data    Feeding Feeding Type: Breast Fed Length of feed:  (Assessed first 15 minutes of BF. )  LATCH Score/Interventions Latch: Grasps breast easily, tongue down, lips flanged, rhythmical sucking.  Audible Swallowing: A few with stimulation Intervention(s): Skin to skin;Hand expression  Type of Nipple: Everted at rest and after stimulation  Comfort (Breast/Nipple): Filling, red/small blisters or bruises, mild/mod discomfort  Problem noted: Mild/Moderate discomfort Interventions  (Cracked/bleeding/bruising/blister): Expressed breast milk to nipple Interventions (Mild/moderate discomfort): Post-pump  Hold (Positioning): No assistance needed to correctly position infant at breast.  LATCH Score: 8  Lactation Tools Discussed/Used     Consult Status Consult Status: PRN    Geralynn OchsWILLIARD, Rishab Stoudt 08/21/2015, 9:50 AM

## 2018-01-26 LAB — OB RESULTS CONSOLE ABO/RH: RH Type: POSITIVE

## 2018-01-26 LAB — OB RESULTS CONSOLE HEPATITIS B SURFACE ANTIGEN: Hepatitis B Surface Ag: NEGATIVE

## 2018-01-26 LAB — OB RESULTS CONSOLE HIV ANTIBODY (ROUTINE TESTING): HIV: NONREACTIVE

## 2018-01-26 LAB — OB RESULTS CONSOLE GC/CHLAMYDIA
Chlamydia: NEGATIVE
Gonorrhea: NEGATIVE

## 2018-01-26 LAB — OB RESULTS CONSOLE ANTIBODY SCREEN: Antibody Screen: NEGATIVE

## 2018-01-26 LAB — OB RESULTS CONSOLE RUBELLA ANTIBODY, IGM: Rubella: IMMUNE

## 2018-01-26 LAB — OB RESULTS CONSOLE RPR: RPR: NONREACTIVE

## 2018-02-01 NOTE — L&D Delivery Note (Signed)
Delivery Note At 3:36 PM a viable female was delivered via Vaginal, Spontaneous (Presentation: LOA).  APGAR: 8, 9; weight pending.   Placenta status: S, I. 3V Cord with the following complications: loose nuchal x 1; delivered through.  Cord pH: n/a  Anesthesia: CLEA   Episiotomy: None Lacerations:  2nd Degree Suture Repair: 3.0 vicryl rapide Est. Blood Loss (mL):  200  Mom to postpartum.  Baby to Couplet care / Skin to Skin.  Linda Hedges 08/28/2018, 3:56 PM

## 2018-08-03 ENCOUNTER — Encounter (HOSPITAL_COMMUNITY): Payer: Self-pay | Admitting: *Deleted

## 2018-08-03 ENCOUNTER — Inpatient Hospital Stay (HOSPITAL_BASED_OUTPATIENT_CLINIC_OR_DEPARTMENT_OTHER): Payer: BC Managed Care – PPO

## 2018-08-03 ENCOUNTER — Inpatient Hospital Stay (HOSPITAL_COMMUNITY)
Admission: AD | Admit: 2018-08-03 | Discharge: 2018-08-03 | Disposition: A | Discharge status: Home / Self Care | Payer: BC Managed Care – PPO | Source: Ambulatory Visit | Attending: Obstetrics and Gynecology | Admitting: Obstetrics and Gynecology

## 2018-08-03 ENCOUNTER — Other Ambulatory Visit: Payer: Self-pay

## 2018-08-03 DIAGNOSIS — Z3A35 35 weeks gestation of pregnancy: Secondary | ICD-10-CM | POA: Diagnosis not present

## 2018-08-03 DIAGNOSIS — O289 Unspecified abnormal findings on antenatal screening of mother: Secondary | ICD-10-CM

## 2018-08-03 DIAGNOSIS — Z3689 Encounter for other specified antenatal screening: Secondary | ICD-10-CM

## 2018-08-03 DIAGNOSIS — O479 False labor, unspecified: Secondary | ICD-10-CM

## 2018-08-03 DIAGNOSIS — O4703 False labor before 37 completed weeks of gestation, third trimester: Secondary | ICD-10-CM | POA: Diagnosis not present

## 2018-08-03 DIAGNOSIS — O368130 Decreased fetal movements, third trimester, not applicable or unspecified: Secondary | ICD-10-CM | POA: Diagnosis present

## 2018-08-03 HISTORY — DX: Anemia, unspecified: D64.9

## 2018-08-03 LAB — URINALYSIS, ROUTINE W REFLEX MICROSCOPIC
Bilirubin Urine: NEGATIVE
Glucose, UA: NEGATIVE mg/dL
Hgb urine dipstick: NEGATIVE
Ketones, ur: 20 mg/dL — AB
Leukocytes,Ua: NEGATIVE
Nitrite: NEGATIVE
Protein, ur: NEGATIVE mg/dL
Specific Gravity, Urine: 1.014 (ref 1.005–1.030)
pH: 7 (ref 5.0–8.0)

## 2018-08-03 IMAGING — US US MFM FETAL BPP WO NON STRESS
1 series · 13 of 18 positions shown · non-contrast
Comparison: none

[Series 1: us mfm fetal bpp wo non stress · 18 acquisitions, 13 frames shown]
[im 1/18]
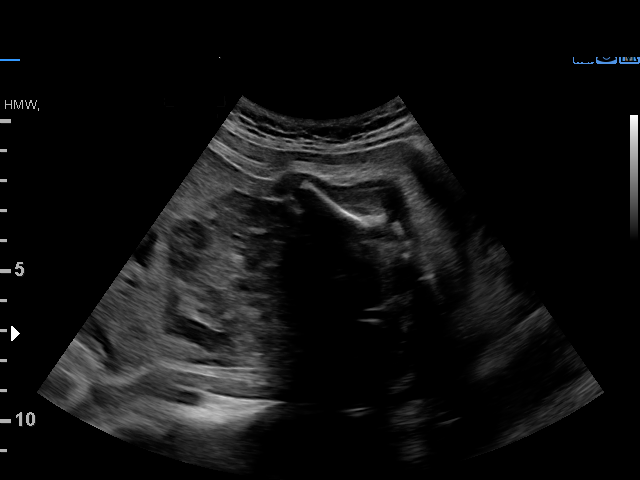
[im 3/18]
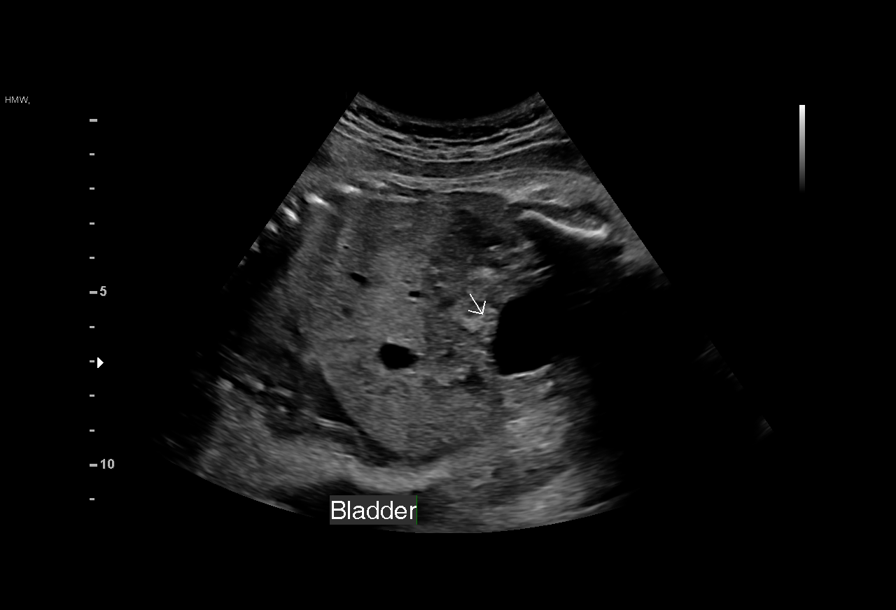
[im 4/18]
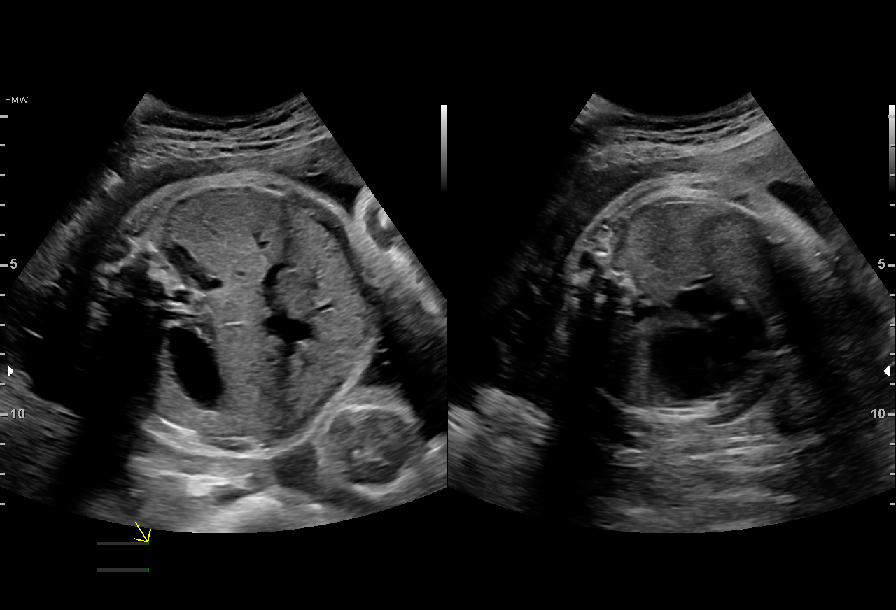
[im 5/18]
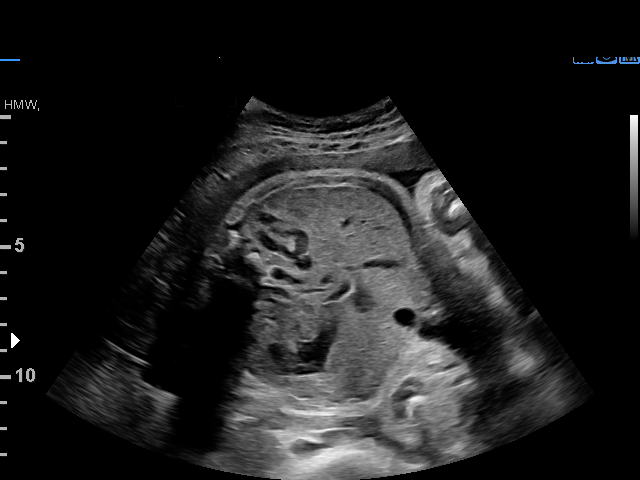
[im 7/18]
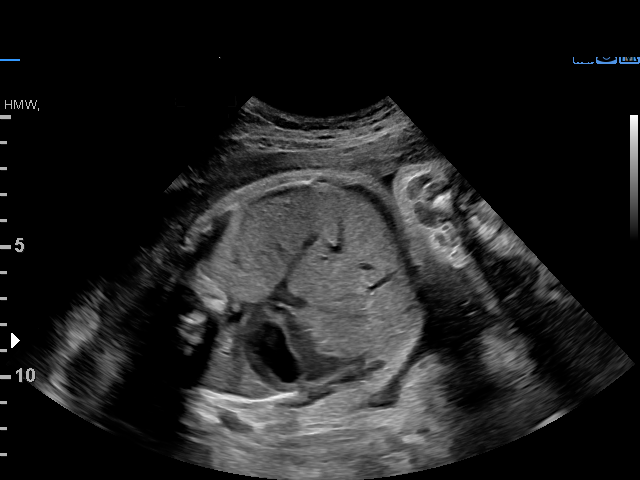
[im 8/18]
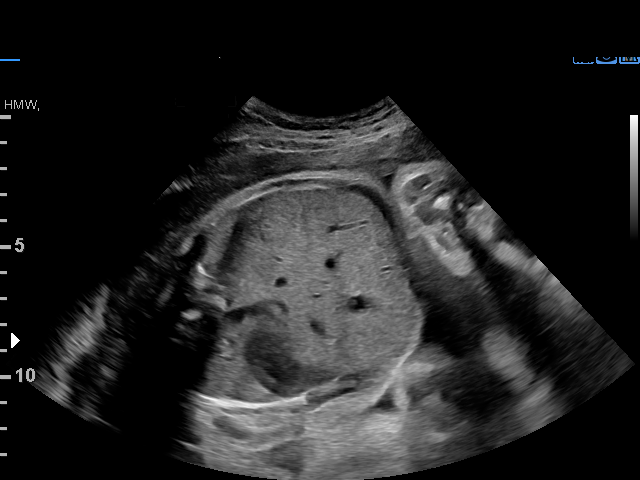
[im 10/18]
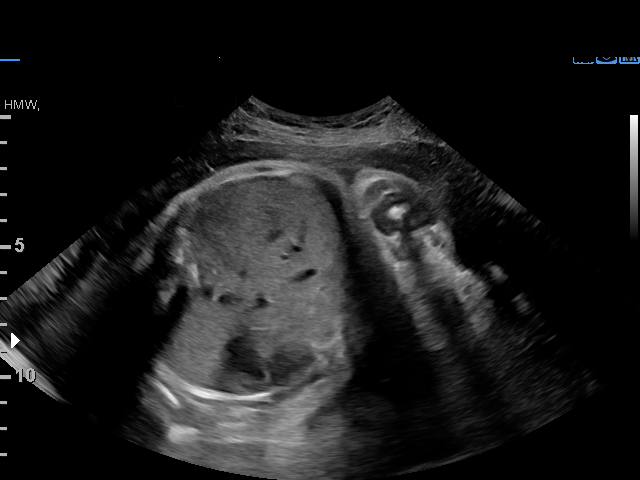
[im 11/18]
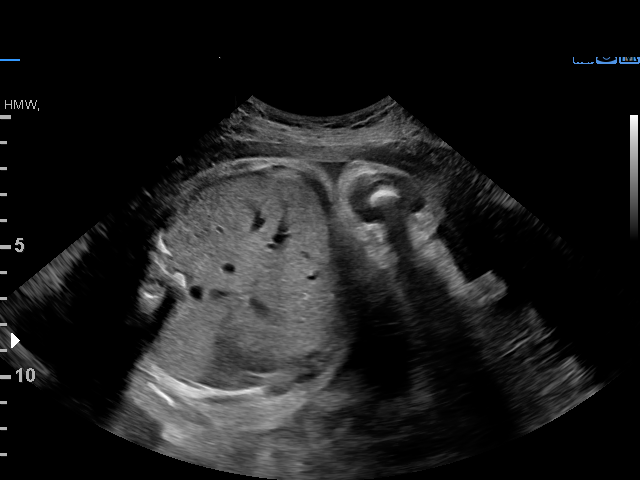
[im 12/18]
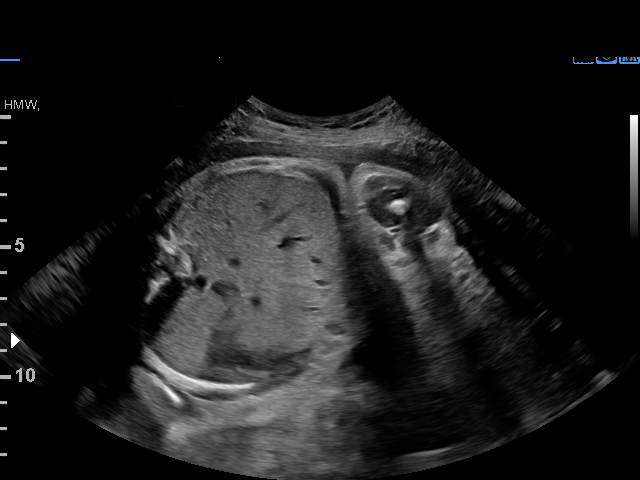
[im 14/18]
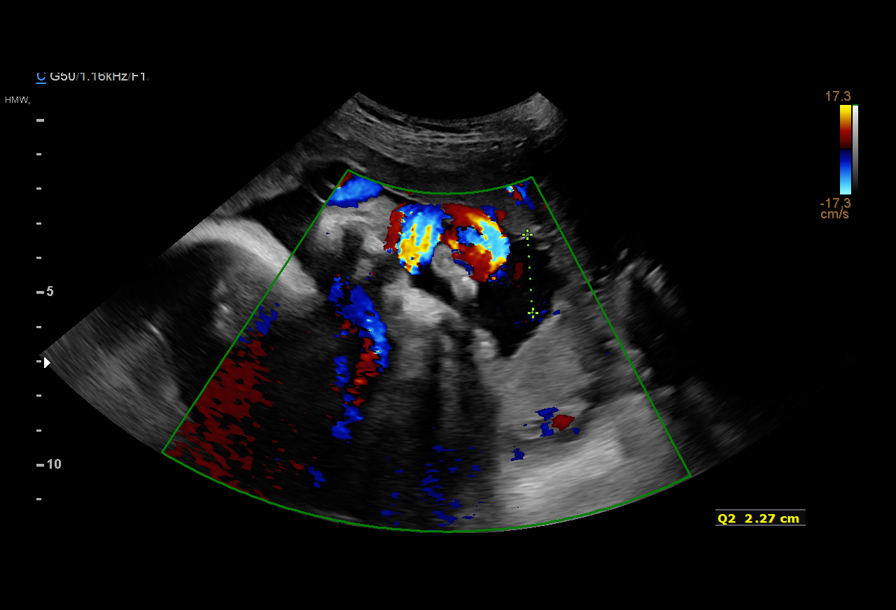
[im 15/18]
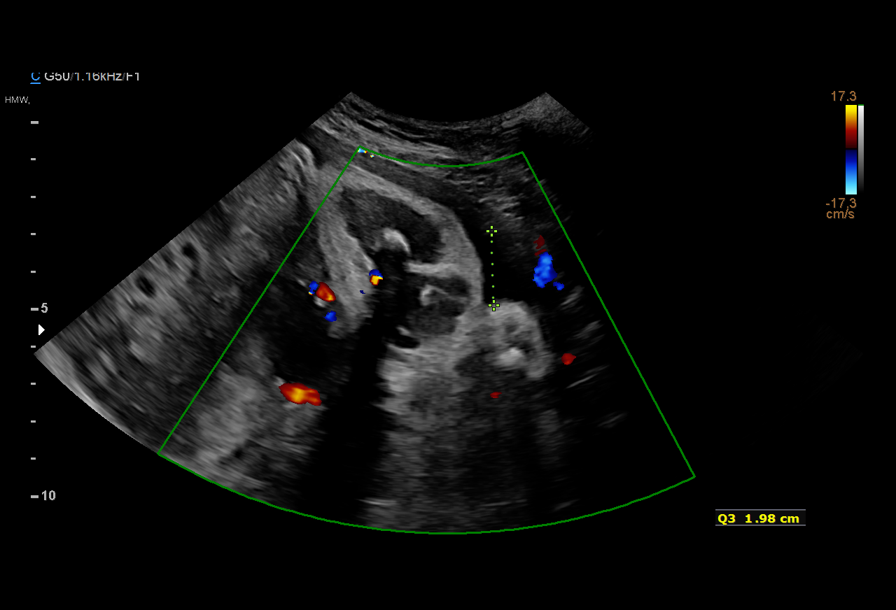
[im 16/18]
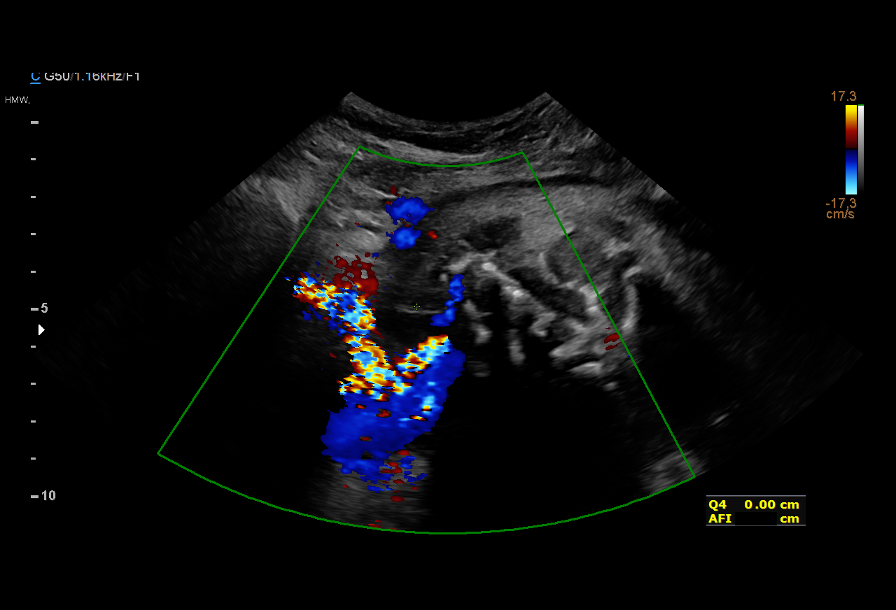
[im 18/18]
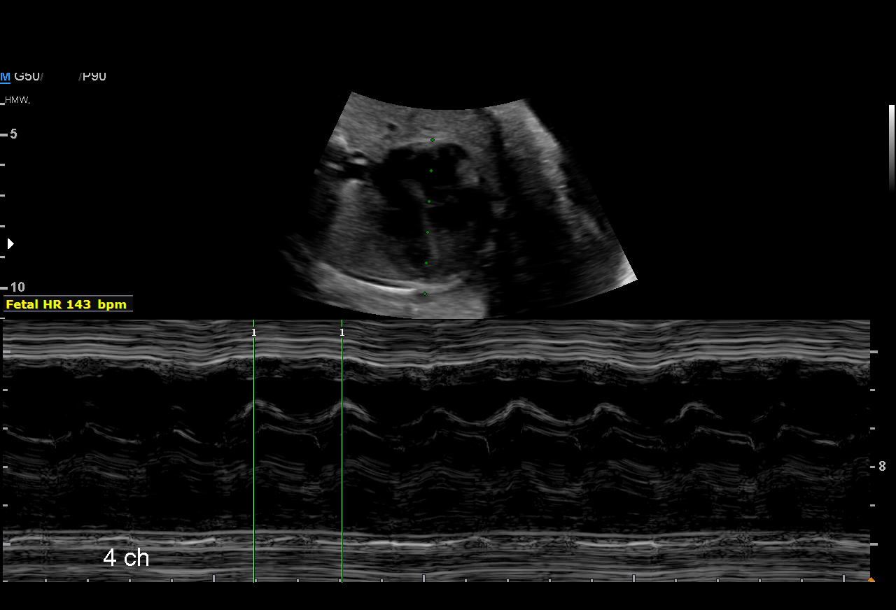

[13 of 18 positions shown; findings below may reference images not displayed]

RIZKY CNM

 ----------------------------------------------------------------------

 ----------------------------------------------------------------------
Indications

  Abnormal finding on antenatal screening
  ([DATE] on BPP in Dr.[REDACTED] today)
  35 weeks gestation of pregnancy
 ----------------------------------------------------------------------
Vital Signs

 BMI:
Fetal Evaluation

 Num Of Fetuses:          1
 Fetal Heart Rate(bpm):   143
 Cardiac Activity:        Observed
 Presentation:            Breech

 Amniotic Fluid
 AFI FV:      Within normal limits

 AFI Sum(cm)     %Tile       Largest Pocket(cm)
 11.77           34

 RUQ(cm)       RLQ(cm)       LUQ(cm)        LLQ(cm)
 7.52          0
Biophysical Evaluation

 Amniotic F.V:   Within normal limits       F. Tone:         Observed
 F. Movement:    Observed                   Score:           [DATE]
 F. Breathing:   Observed
OB History

 Gravidity:    2         Term:   1
 Living:       1
Gestational Age

 Clinical EDD:  35w 3d                                        EDD:   09/04/18
 Best:          35w 3d     Det. By:  Clinical EDD             EDD:   09/04/18
Anatomy

 Thoracic:              Appears normal         Bladder:                Appears normal
 Stomach:               Appears normal, left
                        sided
Cervix Uterus Adnexa

 Cervix
 Not visualized (advanced GA >96wks)
Impression

 Biophysical profile [DATE]
Recommendations

 Follow up per provider

## 2018-08-03 NOTE — Discharge Instructions (Signed)

## 2018-08-03 NOTE — MAU Note (Signed)
Pt sent from MD office for EFM secondary BPP 6/10 in office.  Denies VB or LOF.  Endorses FM.

## 2018-08-03 NOTE — MAU Provider Note (Signed)
History     CSN: [id number]  Arrival date and time: 08/03/18 [id number]   First Provider Initiated Contact with Patient 08/03/18 1819      Chief Complaint  Patient presents with  . EFM   G2P1001 @35 .3 wks sent from office for BPP 6/10. BPP was performed d/t decreased FM reported by pt. Reports good FM now. Also reports increased frequency of BH ctx, not painful. Denies VB or LOF. She admit she may be dehydrated.   OB History    Gravida  2   Para  1   Term  1   Preterm      AB      Living  1     SAB      TAB      Ectopic      Multiple  0   Live Births  1           Past Medical History:  Diagnosis Date  . Anemia   . Medical history non-contributory     Past Surgical History:  Procedure Laterality Date  . WISDOM TOOTH EXTRACTION  2005    Family History  Problem Relation Age of Onset  . Cancer Maternal Grandmother   . Stroke Maternal Grandfather   . Diabetes Paternal Grandmother   . Cancer Paternal Grandmother   . Lung disease Paternal Grandmother   . Heart disease Paternal Grandfather   . Crohn's disease Mother   . Hypertension Father   . Crohn's disease Maternal Uncle     Social History   Tobacco Use  . Smoking status: Never Smoker  . Smokeless tobacco: Never Used  Substance Use Topics  . Alcohol use: Not Currently  . Drug use: No    Allergies:  Allergies  Allergen Reactions  . Benzoyl Peroxide Swelling    Medications Prior to Admission  Medication Sig Dispense Refill Last Dose  . prenatal vitamin w/FE, FA (NATACHEW) 29-1 MG CHEW chewable tablet Chew 1 tablet by mouth daily at 12 noon.   08/02/2018 at 2000    Review of Systems  Constitutional: Negative for fever.  Gastrointestinal: Negative for abdominal pain.  Genitourinary: Negative for vaginal bleeding and vaginal discharge.   Physical Exam   Blood pressure 107/71, pulse 89, temperature 99.1 F (37.3 C), temperature source Oral, resp. rate 20, SpO2 99 %, unknown if currently  breastfeeding.  Physical Exam  Constitutional: She is oriented to person, place, and time. She appears well-developed and well-nourished. No distress (appears comfortable).  HENT:  Head: Normocephalic and atraumatic.  Neck: Normal range of motion.  Cardiovascular: Normal rate.  Respiratory: Effort normal. No respiratory distress.  Genitourinary:    Genitourinary Comments: Cervix: Closed/thick   Musculoskeletal: Normal range of motion.  Neurological: She is alert and oriented to person, place, and time.  Psychiatric: She has a normal mood and affect.  EFM: 145 bpm, mod variability, + accels, no decels Toco: 2-4  Results for orders placed or performed during the hospital encounter of 08/03/18 (from the past 24 hour(s))  Urinalysis, Routine w reflex microscopic     Status: Abnormal   Collection Time: 08/03/18  6:35 PM  Result Value Ref Range   Color, Urine YELLOW YELLOW   APPearance CLEAR CLEAR   Specific Gravity, Urine 1.014 1.005 - 1.030   pH 7.0 5.0 - 8.0   Glucose, UA NEGATIVE NEGATIVE mg/dL   Hgb urine dipstick NEGATIVE NEGATIVE   Bilirubin Urine NEGATIVE NEGATIVE   Ketones, ur 20 (A) NEGATIVE mg/dL  Protein, ur NEGATIVE NEGATIVE mg/dL   Nitrite NEGATIVE NEGATIVE   Leukocytes,Ua NEGATIVE NEGATIVE   MAU Course  Procedures  MDM Labs and BPP ordered. BPP 8/8, AFI 11, NST reactive>BPP10/10. No signs of PTL. Breech presentation (already known), scheduled for appt in office next week, will discuss plan for delivery. Stable for discharge home.   Assessment and Plan   1. [redacted] weeks gestation of pregnancy   2. Abnormal antenatal test   3. NST (non-stress test) reactive   4. Braxton Hicks contractions    Discharge home Follow up at [facility] as scheduled [facility]  Allergies as of 08/03/2018      Reactions   Benzoyl Peroxide Swelling      Medication List    TAKE these medications   prenatal vitamin w/FE, FA 29-1 MG Chew chewable tablet Chew 1 tablet by mouth  daily at 12 noon.      Julianne Handler, CNM 08/03/2018, 7:50 PM

## 2018-08-11 ENCOUNTER — Encounter (HOSPITAL_COMMUNITY): Payer: Self-pay | Admitting: *Deleted

## 2018-08-11 ENCOUNTER — Telehealth (HOSPITAL_COMMUNITY): Payer: Self-pay | Admitting: *Deleted

## 2018-08-11 NOTE — Telephone Encounter (Signed)
Preadmission screen  

## 2018-08-15 ENCOUNTER — Other Ambulatory Visit: Payer: Self-pay

## 2018-08-15 ENCOUNTER — Other Ambulatory Visit (HOSPITAL_COMMUNITY)
Admission: RE | Admit: 2018-08-15 | Discharge: 2018-08-15 | Disposition: A | Discharge status: Home / Self Care | Payer: BC Managed Care – PPO | Source: Ambulatory Visit | Attending: Obstetrics and Gynecology | Admitting: Obstetrics and Gynecology

## 2018-08-15 DIAGNOSIS — Z1159 Encounter for screening for other viral diseases: Secondary | ICD-10-CM | POA: Insufficient documentation

## 2018-08-15 LAB — SARS CORONAVIRUS 2 (TAT 6-24 HRS): SARS Coronavirus 2: NEGATIVE

## 2018-08-15 NOTE — MAU Note (Signed)
Covid swab collected. PT tolerated well. PT asymptomatic 

## 2018-08-16 NOTE — H&P (Signed)
Krystal Cunningham is a 28 y.o. female G2P1001 at 70 weeks presenting for external cephalic version.  Baby in breech presentation, AFI 17.6 cm, anterior placenta.  Patient has been counseled re: options for expectant management vs ECV vs primary C/S and she prefers ECV.  No antepartum complications.  Blood type A pos.  OB History    Gravida  2   Para  1   Term  1   Preterm      AB      Living  1     SAB      TAB      Ectopic      Multiple  0   Live Births  1          Past Medical History:  Diagnosis Date  . Anemia   . Medical history non-contributory    Past Surgical History:  Procedure Laterality Date  . WISDOM TOOTH EXTRACTION  2005   Family History: family history includes Cancer in her maternal grandmother and paternal grandmother; Crohn's disease in her maternal uncle and mother; Diabetes in her paternal grandmother; Heart disease in her paternal grandfather; Hypertension in her father; Lung disease in her paternal grandmother; Stroke in her maternal grandfather. Social History:  reports that she has never smoked. She has never used smokeless tobacco. She reports previous alcohol use. She reports that she does not use drugs.     Maternal Diabetes: No Genetic Screening: Normal Maternal Ultrasounds/Referrals: Normal Fetal Ultrasounds or other Referrals:  None Maternal Substance Abuse:  No Significant Maternal Medications:  None Significant Maternal Lab Results:  None Other Comments:  None  ROS Maternal Medical History:  Fetal activity: Perceived fetal activity is normal.   Last perceived fetal movement was within the past hour.    Prenatal complications: no prenatal complications Prenatal Complications - Diabetes: none.      unknown if currently breastfeeding. Maternal Exam:  Uterine Assessment: Contraction strength is mild.  Contraction frequency is rare.   Abdomen: Patient reports no abdominal tenderness. Fundal height is c/w dates.   Fetal  presentation: breech     Physical Exam  Constitutional: She is oriented to person, place, and time. She appears well-developed and well-nourished.  GI: Soft. There is no rebound and no guarding.  Neurological: She is alert and oriented to person, place, and time.  Skin: Skin is warm and dry.  Psychiatric: She has a normal mood and affect. Her behavior is normal.    Prenatal labs: ABO, Rh: A/Positive/-- (12/26 0000) Antibody: Negative (12/26 0000) Rubella: Immune (12/26 0000) RPR: Nonreactive (12/26 0000)  HBsAg: Negative (12/26 0000)  HIV: Non-reactive (12/26 0000)  GBS:     Assessment/Plan: 28yo G2P1 at 37 weeks with breech presentation -ECV-Patient has been counseled re: 50% success rate, risk of fetal distress necessitating stat C/S, and if turned, possibility of turning back to breech.  All questions have been answered and the patient wishes to proceed.   Linda Hedges 08/16/2018, 2:32 PM

## 2018-08-17 ENCOUNTER — Inpatient Hospital Stay (HOSPITAL_COMMUNITY): Payer: BC Managed Care – PPO

## 2018-08-18 ENCOUNTER — Telehealth (HOSPITAL_COMMUNITY): Payer: Self-pay | Admitting: *Deleted

## 2018-08-18 NOTE — Telephone Encounter (Signed)
Preadmission screen  

## 2018-08-24 ENCOUNTER — Other Ambulatory Visit: Payer: Self-pay

## 2018-08-24 ENCOUNTER — Other Ambulatory Visit (HOSPITAL_COMMUNITY)
Admission: RE | Admit: 2018-08-24 | Discharge: 2018-08-24 | Disposition: A | Discharge status: Home / Self Care | Payer: BC Managed Care – PPO | Source: Ambulatory Visit | Attending: Obstetrics and Gynecology | Admitting: Obstetrics and Gynecology

## 2018-08-24 DIAGNOSIS — Z1159 Encounter for screening for other viral diseases: Secondary | ICD-10-CM | POA: Diagnosis present

## 2018-08-24 LAB — SARS CORONAVIRUS 2 (TAT 6-24 HRS): SARS Coronavirus 2: NEGATIVE

## 2018-08-24 NOTE — MAU Note (Signed)
Asymptomatic, swab collected. 

## 2018-08-28 ENCOUNTER — Inpatient Hospital Stay (HOSPITAL_COMMUNITY): Payer: BC Managed Care – PPO | Admitting: Anesthesiology

## 2018-08-28 ENCOUNTER — Encounter (HOSPITAL_COMMUNITY): Payer: Self-pay

## 2018-08-28 ENCOUNTER — Other Ambulatory Visit: Payer: Self-pay

## 2018-08-28 ENCOUNTER — Inpatient Hospital Stay (HOSPITAL_COMMUNITY): Payer: BC Managed Care – PPO

## 2018-08-28 ENCOUNTER — Inpatient Hospital Stay (HOSPITAL_COMMUNITY)
Admission: AD | Admit: 2018-08-28 | Discharge: 2018-08-29 | DRG: 807 | Disposition: A | Discharge status: Home / Self Care | Payer: BC Managed Care – PPO | Attending: Obstetrics & Gynecology | Admitting: Obstetrics & Gynecology

## 2018-08-28 DIAGNOSIS — Z3490 Encounter for supervision of normal pregnancy, unspecified, unspecified trimester: Secondary | ICD-10-CM

## 2018-08-28 DIAGNOSIS — O321XX0 Maternal care for breech presentation, not applicable or unspecified: Secondary | ICD-10-CM | POA: Diagnosis present

## 2018-08-28 DIAGNOSIS — O9902 Anemia complicating childbirth: Secondary | ICD-10-CM | POA: Diagnosis present

## 2018-08-28 DIAGNOSIS — O99824 Streptococcus B carrier state complicating childbirth: Secondary | ICD-10-CM | POA: Diagnosis present

## 2018-08-28 DIAGNOSIS — Z3A37 37 weeks gestation of pregnancy: Secondary | ICD-10-CM

## 2018-08-28 DIAGNOSIS — Z3A39 39 weeks gestation of pregnancy: Secondary | ICD-10-CM

## 2018-08-28 DIAGNOSIS — D509 Iron deficiency anemia, unspecified: Secondary | ICD-10-CM | POA: Diagnosis present

## 2018-08-28 LAB — CBC
HCT: 32.1 % — ABNORMAL LOW (ref 36.0–46.0)
Hemoglobin: 10.7 g/dL — ABNORMAL LOW (ref 12.0–15.0)
MCH: 31.7 pg (ref 26.0–34.0)
MCHC: 33.3 g/dL (ref 30.0–36.0)
MCV: 95 fL (ref 80.0–100.0)
Platelets: 200 10*3/uL (ref 150–400)
RBC: 3.38 MIL/uL — ABNORMAL LOW (ref 3.87–5.11)
RDW: 12.6 % (ref 11.5–15.5)
WBC: 10 10*3/uL (ref 4.0–10.5)
nRBC: 0 % (ref 0.0–0.2)

## 2018-08-28 LAB — RPR: RPR Ser Ql: NONREACTIVE

## 2018-08-28 LAB — TYPE AND SCREEN
ABO/RH(D): A POS
Antibody Screen: NEGATIVE

## 2018-08-28 LAB — ABO/RH: ABO/RH(D): A POS

## 2018-08-28 MED ORDER — COCONUT OIL OIL
1.0000 "application " | TOPICAL_OIL | Status: DC | PRN
  Administered 2018-08-29: 1 via TOPICAL

## 2018-08-28 MED ORDER — SOD CITRATE-CITRIC ACID 500-334 MG/5ML PO SOLN: 30.0000 mL | ORAL | Status: DC | PRN

## 2018-08-28 MED ORDER — PENICILLIN G 3 MILLION UNITS IVPB - SIMPLE MED
3.0000 10*6.[IU] | INTRAVENOUS | Status: DC
  Administered 2018-08-28 (×3): 3 10*6.[IU] via INTRAVENOUS
  Filled 2018-08-28 (×3): qty 100

## 2018-08-28 MED ORDER — SODIUM CHLORIDE 0.9 % IV SOLN
5.0000 10*6.[IU] | Freq: Once | INTRAVENOUS | Status: AC
  Administered 2018-08-28: 5 10*6.[IU] via INTRAVENOUS
  Filled 2018-08-28: qty 5

## 2018-08-28 MED ORDER — OXYTOCIN 40 UNITS IN NORMAL SALINE INFUSION - SIMPLE MED
1.0000 m[IU]/min | INTRAVENOUS | Status: DC
  Administered 2018-08-28: 2 m[IU]/min via INTRAVENOUS

## 2018-08-28 MED ORDER — TERBUTALINE SULFATE 1 MG/ML IJ SOLN
0.2500 mg | Freq: Once | INTRAMUSCULAR | Status: AC | PRN
  Administered 2018-08-28: 0.25 mg via SUBCUTANEOUS

## 2018-08-28 MED ORDER — ONDANSETRON HCL 4 MG/2ML IJ SOLN: 4.0000 mg | Freq: Four times a day (QID) | INTRAMUSCULAR | Status: DC | PRN

## 2018-08-28 MED ORDER — LACTATED RINGERS IV SOLN: 500.0000 mL | Freq: Once | INTRAVENOUS | Status: DC

## 2018-08-28 MED ORDER — TETANUS-DIPHTH-ACELL PERTUSSIS 5-2.5-18.5 LF-MCG/0.5 IM SUSP: 0.5000 mL | Freq: Once | INTRAMUSCULAR | Status: DC

## 2018-08-28 MED ORDER — FENTANYL-BUPIVACAINE-NACL 0.5-0.125-0.9 MG/250ML-% EP SOLN
12.0000 mL/h | EPIDURAL | Status: DC | PRN
  Filled 2018-08-28: qty 250

## 2018-08-28 MED ORDER — DIBUCAINE (PERIANAL) 1 % EX OINT: 1.0000 "application " | TOPICAL_OINTMENT | CUTANEOUS | Status: DC | PRN

## 2018-08-28 MED ORDER — ZOLPIDEM TARTRATE 5 MG PO TABS: 5.0000 mg | ORAL_TABLET | Freq: Every evening | ORAL | Status: DC | PRN

## 2018-08-28 MED ORDER — OXYTOCIN BOLUS FROM INFUSION
500.0000 mL | Freq: Once | INTRAVENOUS | Status: AC
  Administered 2018-08-28: 500 mL via INTRAVENOUS

## 2018-08-28 MED ORDER — LACTATED RINGERS IV SOLN
INTRAVENOUS | Status: DC
  Administered 2018-08-28 (×2): via INTRAVENOUS

## 2018-08-28 MED ORDER — PHENYLEPHRINE 40 MCG/ML (10ML) SYRINGE FOR IV PUSH (FOR BLOOD PRESSURE SUPPORT)
80.0000 ug | PREFILLED_SYRINGE | INTRAVENOUS | Status: DC | PRN
  Filled 2018-08-28: qty 10

## 2018-08-28 MED ORDER — DIPHENHYDRAMINE HCL 50 MG/ML IJ SOLN: 12.5000 mg | INTRAMUSCULAR | Status: DC | PRN

## 2018-08-28 MED ORDER — IBUPROFEN 600 MG PO TABS
600.0000 mg | ORAL_TABLET | Freq: Four times a day (QID) | ORAL | Status: DC
  Administered 2018-08-28 – 2018-08-29 (×4): 600 mg via ORAL
  Filled 2018-08-28 (×4): qty 1

## 2018-08-28 MED ORDER — ACETAMINOPHEN 325 MG PO TABS: 650.0000 mg | ORAL_TABLET | ORAL | Status: DC | PRN

## 2018-08-28 MED ORDER — TERBUTALINE SULFATE 1 MG/ML IJ SOLN
0.2500 mg | Freq: Once | INTRAMUSCULAR | Status: DC
  Filled 2018-08-28: qty 1

## 2018-08-28 MED ORDER — ONDANSETRON HCL 4 MG PO TABS: 4.0000 mg | ORAL_TABLET | ORAL | Status: DC | PRN

## 2018-08-28 MED ORDER — SIMETHICONE 80 MG PO CHEW: 80.0000 mg | CHEWABLE_TABLET | ORAL | Status: DC | PRN

## 2018-08-28 MED ORDER — FENTANYL CITRATE (PF) 100 MCG/2ML IJ SOLN
50.0000 ug | INTRAMUSCULAR | Status: DC | PRN
  Administered 2018-08-28: 100 ug via INTRAVENOUS
  Filled 2018-08-28: qty 2

## 2018-08-28 MED ORDER — LIDOCAINE HCL (PF) 1 % IJ SOLN: 30.0000 mL | INTRAMUSCULAR | Status: DC | PRN

## 2018-08-28 MED ORDER — FENTANYL-BUPIVACAINE-NACL 0.5-0.125-0.9 MG/250ML-% EP SOLN: 12.0000 mL/h | EPIDURAL | Status: DC | PRN

## 2018-08-28 MED ORDER — EPHEDRINE 5 MG/ML INJ: 10.0000 mg | INTRAVENOUS | Status: DC | PRN

## 2018-08-28 MED ORDER — MISOPROSTOL 25 MCG QUARTER TABLET
25.0000 ug | ORAL_TABLET | ORAL | Status: DC | PRN
  Administered 2018-08-28 (×2): 25 ug via VAGINAL
  Filled 2018-08-28 (×2): qty 1

## 2018-08-28 MED ORDER — OXYCODONE-ACETAMINOPHEN 5-325 MG PO TABS: 2.0000 | ORAL_TABLET | ORAL | Status: DC | PRN

## 2018-08-28 MED ORDER — LACTATED RINGERS IV SOLN: 500.0000 mL | INTRAVENOUS | Status: DC | PRN

## 2018-08-28 MED ORDER — PHENYLEPHRINE 40 MCG/ML (10ML) SYRINGE FOR IV PUSH (FOR BLOOD PRESSURE SUPPORT): 80.0000 ug | PREFILLED_SYRINGE | INTRAVENOUS | Status: DC | PRN

## 2018-08-28 MED ORDER — DIPHENHYDRAMINE HCL 25 MG PO CAPS: 25.0000 mg | ORAL_CAPSULE | Freq: Four times a day (QID) | ORAL | Status: DC | PRN

## 2018-08-28 MED ORDER — OXYCODONE-ACETAMINOPHEN 5-325 MG PO TABS: 1.0000 | ORAL_TABLET | ORAL | Status: DC | PRN

## 2018-08-28 MED ORDER — PRENATAL MULTIVITAMIN CH
1.0000 | ORAL_TABLET | Freq: Every day | ORAL | Status: DC
  Administered 2018-08-29: 1 via ORAL
  Filled 2018-08-28: qty 1

## 2018-08-28 MED ORDER — OXYTOCIN 40 UNITS IN NORMAL SALINE INFUSION - SIMPLE MED: 1.0000 m[IU]/min | INTRAVENOUS | Status: DC

## 2018-08-28 MED ORDER — LIDOCAINE HCL (PF) 1 % IJ SOLN
INTRAMUSCULAR | Status: DC | PRN
  Administered 2018-08-28: 5 mL via EPIDURAL

## 2018-08-28 MED ORDER — OXYTOCIN 40 UNITS IN NORMAL SALINE INFUSION - SIMPLE MED
2.5000 [IU]/h | INTRAVENOUS | Status: DC
  Filled 2018-08-28: qty 1000

## 2018-08-28 MED ORDER — WITCH HAZEL-GLYCERIN EX PADS
1.0000 "application " | MEDICATED_PAD | CUTANEOUS | Status: DC | PRN
  Administered 2018-08-29: 1 via TOPICAL

## 2018-08-28 MED ORDER — SODIUM CHLORIDE (PF) 0.9 % IJ SOLN
INTRAMUSCULAR | Status: DC | PRN
  Administered 2018-08-28: 12 mL/h via EPIDURAL

## 2018-08-28 MED ORDER — BENZOCAINE-MENTHOL 20-0.5 % EX AERO
1.0000 "application " | INHALATION_SPRAY | CUTANEOUS | Status: DC | PRN
  Administered 2018-08-29: 1 via TOPICAL
  Filled 2018-08-28: qty 56

## 2018-08-28 MED ORDER — FLEET ENEMA 7-19 GM/118ML RE ENEM: 1.0000 | ENEMA | RECTAL | Status: DC | PRN

## 2018-08-28 MED ORDER — SENNOSIDES-DOCUSATE SODIUM 8.6-50 MG PO TABS
2.0000 | ORAL_TABLET | ORAL | Status: DC
  Administered 2018-08-29: 2 via ORAL
  Filled 2018-08-28: qty 2

## 2018-08-28 MED ORDER — LACTATED RINGERS IV SOLN: INTRAVENOUS | Status: DC

## 2018-08-28 MED ORDER — ONDANSETRON HCL 4 MG/2ML IJ SOLN: 4.0000 mg | INTRAMUSCULAR | Status: DC | PRN

## 2018-08-28 NOTE — Progress Notes (Signed)
CTSP secondary to prolonged deceleration x 8 minutes.  Run of tachysystole noted.  Pitocin turned off and terbutaline given.  CVX 7/c/0.  Repositioning to hands and knees returned FHT to baseline with moderate variability.  Will closely monitor.  Restart pitocin after 30 minutes of reassuring monitoring.  Linda Hedges, DO

## 2018-08-28 NOTE — Anesthesia Procedure Notes (Signed)
Epidural Patient location during procedure: OB Start time: 08/28/2018 8:44 AM End time: 08/28/2018 8:50 AM  Staffing Anesthesiologist: Effie Berkshire, MD Performed: anesthesiologist   Preanesthetic Checklist Completed: patient identified, site marked, surgical consent, pre-op evaluation, timeout performed, IV checked, risks and benefits discussed and monitors and equipment checked  Epidural Patient position: sitting Prep: ChloraPrep Patient monitoring: heart rate, continuous pulse ox and blood pressure Approach: midline Location: L3-L4 Injection technique: LOR saline  Needle:  Needle type: Tuohy  Needle gauge: 17 G Needle length: 9 cm Catheter type: closed end flexible Catheter size: 20 Guage Test dose: negative and 1.5% lidocaine  Assessment Events: blood not aspirated, injection not painful, no injection resistance and no paresthesia  Additional Notes LOR @ 4.5  Patient identified. Risks/Benefits/Options discussed with patient including but not limited to bleeding, infection, nerve damage, paralysis, failed block, incomplete pain control, headache, blood pressure changes, nausea, vomiting, reactions to medications, itching and postpartum back pain. Confirmed with bedside nurse the patient's most recent platelet count. Confirmed with patient that they are not currently taking any anticoagulation, have any bleeding history or any family history of bleeding disorders. Patient expressed understanding and wished to proceed. All questions were answered. Sterile technique was used throughout the entire procedure. Please see nursing notes for vital signs. Test dose was given through epidural catheter and negative prior to continuing to dose epidural or start infusion. Warning signs of high block given to the patient including shortness of breath, tingling/numbness in hands, complete motor block, or any concerning symptoms with instructions to call for help. Patient was given instructions  on fall risk and not to get out of bed. All questions and concerns addressed with instructions to call with any issues or inadequate analgesia.    Reason for block:procedure for pain

## 2018-08-28 NOTE — Progress Notes (Signed)
Comfortable with epidural. FHT Cat I CVX 3/75/-1, vtx AROM clear Continue pitocin  Linda Hedges, DO

## 2018-08-28 NOTE — Anesthesia Preprocedure Evaluation (Signed)
Anesthesia Evaluation  Patient identified by MRN, date of birth, ID band Patient awake    Reviewed: Allergy & Precautions, Patient's Chart, lab work & pertinent test results  Airway Mallampati: I       Dental no notable dental hx.    Pulmonary    Pulmonary exam normal        Cardiovascular Normal cardiovascular exam     Neuro/Psych    GI/Hepatic   Endo/Other    Renal/GU      Musculoskeletal   Abdominal Normal abdominal exam  (+)   Peds  Hematology  (+) anemia ,   Anesthesia Other Findings   Reproductive/Obstetrics (+) Pregnancy                             Anesthesia Physical Anesthesia Plan  ASA: II  Anesthesia Plan: Epidural   Post-op Pain Management:    Induction:   PONV Risk Score and Plan:   Airway Management Planned: Natural Airway  Additional Equipment:   Intra-op Plan:   Post-operative Plan:   Informed Consent: I have reviewed the patients History and Physical, chart, labs and discussed the procedure including the risks, benefits and alternatives for the proposed anesthesia with the patient or authorized representative who has indicated his/her understanding and acceptance.       Plan Discussed with:   Anesthesia Plan Comments: (Lab Results      Component                Value               Date                      WBC                      10.0                08/28/2018                HGB                      10.7 (L)            08/28/2018                HCT                      32.1 (L)            08/28/2018                MCV                      95.0                08/28/2018                PLT                      200                 08/28/2018            COVID-19 Labs  No results for input(s): DDIMER, FERRITIN, LDH, CRP in the last 72 hours.  Lab Results      Component  Value               Date                      [id number]               NEGATIVE            08/24/2018                [place]              NEGATIVE            08/15/2018            )        Anesthesia Quick Evaluation

## 2018-08-28 NOTE — H&P (Signed)
Krystal Cunningham is a 28 y.o. female G2P1001 at 50 weeks presenting for elective IOL.  Patient received 2nd dose of VMP at 0516 and has started to feel increased CTX intensity; s/p fentanyl x 1.  Active FM.  No LOF or VB.  Antepartum course complicated by iron deficiency anemia.  GBS positive.   OB History    Gravida  2   Para  1   Term  1   Preterm      AB      Living  1     SAB      TAB      Ectopic      Multiple  0   Live Births  1          Past Medical History:  Diagnosis Date  . Anemia   . Medical history non-contributory    Past Surgical History:  Procedure Laterality Date  . WISDOM TOOTH EXTRACTION  2005   Family History: family history includes Cancer in her maternal grandmother and paternal grandmother; Crohn's disease in her maternal uncle and mother; Diabetes in her paternal grandmother; Heart disease in her paternal grandfather; Hypertension in her father; Lung disease in her paternal grandmother; Stroke in her maternal grandfather. Social History:  reports that she has never smoked. She has never used smokeless tobacco. She reports previous alcohol use. She reports that she does not use drugs.     Maternal Diabetes: No Genetic Screening: Normal Maternal Ultrasounds/Referrals: Normal Fetal Ultrasounds or other Referrals:  None Maternal Substance Abuse:  No Significant Maternal Medications:  None Significant Maternal Lab Results:  Group B Strep positive Other Comments:  None  ROS Maternal Medical History:  Fetal activity: Perceived fetal activity is normal.   Last perceived fetal movement was within the past hour.    Prenatal complications: no prenatal complications Prenatal Complications - Diabetes: none.    Dilation: 1 Effacement (%): 50 Station: -2 Exam by:: Craig Guess, RN Blood pressure (!) 89/56, pulse 74, temperature 98.3 F (36.8 C), temperature source Oral, resp. rate 16, height 5\' 1"  (1.549 m), weight 67.4 kg, unknown if currently  breastfeeding. Maternal Exam:  Uterine Assessment: Contraction strength is moderate.  Contraction frequency is regular.   Abdomen: Patient reports no abdominal tenderness. Fundal height is c/w dates.   Estimated fetal weight is 7#8.       Fetal Exam Fetal Monitor Review: Baseline rate: 135.  Variability: moderate (6-25 bpm).   Pattern: accelerations present and no decelerations.    Fetal State Assessment: Category I - tracings are normal.     Physical Exam  Constitutional: She is oriented to person, place, and time. She appears well-developed and well-nourished.  GI: Soft. There is no rebound and no guarding.  Neurological: She is alert and oriented to person, place, and time.  Skin: Skin is warm and dry.  Psychiatric: She has a normal mood and affect. Her behavior is normal.    Prenatal labs: ABO, Rh: --/--/A POS, A POS Performed at [facility], [place] [address], [place], [place] [address]  [phone]) Antibody: NEG (07/27 0035) Rubella: Immune (12/26 0000) RPR: Nonreactive (12/26 0000)  HBsAg: Negative (12/26 0000)  HIV: Non-reactive (12/26 0000)  GBS:   Positive  Assessment/Plan: 28yo G2P1001 at 39 weeks for elective IOL -PCN for GBS ppx -Patient planning CLEA -Once comfortable with epidural, plan to AROM -Anticipate NSVD   Linda Hedges 08/28/2018, 8:14 AM

## 2018-08-29 LAB — CBC
HCT: 31 % — ABNORMAL LOW (ref 36.0–46.0)
Hemoglobin: 10.3 g/dL — ABNORMAL LOW (ref 12.0–15.0)
MCH: 31.6 pg (ref 26.0–34.0)
MCHC: 33.2 g/dL (ref 30.0–36.0)
MCV: 95.1 fL (ref 80.0–100.0)
Platelets: 171 10*3/uL (ref 150–400)
RBC: 3.26 MIL/uL — ABNORMAL LOW (ref 3.87–5.11)
RDW: 12.7 % (ref 11.5–15.5)
WBC: 10.8 10*3/uL — ABNORMAL HIGH (ref 4.0–10.5)
nRBC: 0 % (ref 0.0–0.2)

## 2018-08-29 MED ORDER — ACETAMINOPHEN 325 MG PO TABS: 650.0000 mg | ORAL_TABLET | Freq: Four times a day (QID) | ORAL | 1 refills | Status: AC | PRN

## 2018-08-29 MED ORDER — IBUPROFEN 600 MG PO TABS: 600.0000 mg | ORAL_TABLET | Freq: Four times a day (QID) | ORAL | 1 refills | Status: AC | PRN

## 2018-08-29 NOTE — Anesthesia Postprocedure Evaluation (Signed)
Anesthesia Post Note  Patient: Krystal Cunningham  Procedure(s) Performed: AN AD HOC LABOR EPIDURAL     Patient location during evaluation: Mother Baby Anesthesia Type: Epidural Level of consciousness: awake Pain management: satisfactory to patient Vital Signs Assessment: post-procedure vital signs reviewed and stable Respiratory status: spontaneous breathing Cardiovascular status: stable Anesthetic complications: no    Last Vitals:  Vitals:   08/29/18 0012 08/29/18 0516  BP: 99/62 106/78  Pulse: 62 64  Resp: 18 18  Temp: 36.8 C 36.8 C  SpO2:      Last Pain:  Vitals:   08/29/18 0516  TempSrc: Oral  PainSc: 5    Pain Goal:                   [name]

## 2018-08-29 NOTE — Lactation Note (Signed)
This note was copied from a baby's chart. Lactation Consultation Note Baby 61 hrs old. Mom stated baby is cluster feeding. Mom states baby is BF great. Mom has everted nipples w/Rt. Nipple slightly pinched. Cheeks to breast. Mom stated baby didn't have cheeks to breast. Discussed positioning and support. Assisted in football, latched baby. Chin tug demonstrated. Mom stated much better. Mom BF her 1st child for 1 yr. Mom has no questions or concerns. Engorgement management, I&O, STS, feeding habits, behaviors, breast massage, milk storage, supply and demand discussed. Lactation brochure given. Encouraged to call for assistance or questions.  Patient Name: Krystal Cunningham [id number] Date: 08/29/2018 Reason for consult: Initial assessment;Term   Maternal Data Has patient been taught Hand Expression?: Yes Does the patient have breastfeeding experience prior to this delivery?: Yes  Feeding Feeding Type: Breast Fed  LATCH Score Latch: Grasps breast easily, tongue down, lips flanged, rhythmical sucking.  Audible Swallowing: Spontaneous and intermittent  Type of Nipple: Everted at rest and after stimulation  Comfort (Breast/Nipple): Soft / non-tender  Hold (Positioning): Assistance needed to correctly position infant at breast and maintain latch.  LATCH Score: 9  Interventions Interventions: Breast feeding basics reviewed;Adjust position;Assisted with latch;Support pillows;Skin to skin;Position options;Breast massage;Hand express;Breast compression  Lactation Tools Discussed/Used WIC Program: No   Consult Status Consult Status: Complete Date: 08/29/18    Theodoro Kalata 08/29/2018, 7:27 AM

## 2018-08-29 NOTE — Discharge Summary (Signed)
Obstetric Discharge Summary Reason for Admission: induction of labor Prenatal Procedures: none Intrapartum Procedures: spontaneous vaginal delivery Postpartum Procedures: none Complications-Operative and Postpartum: none Hemoglobin  Date Value Ref Range Status  08/29/2018 10.3 (L) 12.0 - 15.0 g/dL Final   HCT  Date Value Ref Range Status  08/29/2018 31.0 (L) 36.0 - 46.0 % Final    Physical Exam:  General: alert, cooperative and no distress Lochia: appropriate Uterine Fundus: firm Incision: healing well DVT Evaluation: No evidence of DVT seen on physical exam.  Discharge Diagnoses: Term Pregnancy-delivered  Discharge Information: Date: 08/29/2018 Activity: pelvic rest Diet: routine Medications: PNV and Ibuprofen Condition: stable Instructions: refer to practice specific booklet Discharge to: home   Newborn Data: Live born female  Birth Weight: 6 lb 8.2 oz (2955 g) APGAR: 8, 9  Newborn Delivery   Birth date/time: 08/28/2018 15:36:00 Delivery type: Vaginal, Spontaneous      Home with mother.  Shon Millet II 08/29/2018, 7:03 AM

## 2018-08-29 NOTE — Plan of Care (Signed)
  Problem: Education: Goal: Knowledge of condition will improve Outcome: Completed/Met   Problem: Activity: Goal: Will verbalize the importance of balancing activity with adequate rest periods Outcome: Completed/Met   Problem: Life Cycle: Goal: Chance of risk for complications during the postpartum period will decrease Outcome: Completed/Met   Problem: Role Relationship: Goal: Ability to demonstrate positive interaction with newborn will improve Outcome: Completed/Met

## 2018-12-26 ENCOUNTER — Other Ambulatory Visit: Payer: Self-pay

## 2018-12-26 DIAGNOSIS — Z20822 Contact with and (suspected) exposure to covid-19: Secondary | ICD-10-CM

## 2018-12-27 LAB — NOVEL CORONAVIRUS, NAA: SARS-CoV-2, NAA: NOT DETECTED
